# Patient Record
Sex: Female | Born: 1975 | Race: White | Hispanic: No | Marital: Single | Smoking: Former smoker
Health system: Southern US, Community
[De-identification: ages and names within clinical notes are randomized; demographics above are authoritative.]

## PROBLEM LIST (undated history)

## (undated) ENCOUNTER — Inpatient Hospital Stay (HOSPITAL_COMMUNITY): Payer: Self-pay

## (undated) DIAGNOSIS — E079 Disorder of thyroid, unspecified: Secondary | ICD-10-CM

## (undated) DIAGNOSIS — G8929 Other chronic pain: Secondary | ICD-10-CM

## (undated) DIAGNOSIS — E039 Hypothyroidism, unspecified: Secondary | ICD-10-CM

## (undated) DIAGNOSIS — M549 Dorsalgia, unspecified: Secondary | ICD-10-CM

## (undated) DIAGNOSIS — F419 Anxiety disorder, unspecified: Secondary | ICD-10-CM

## (undated) HISTORY — PX: OTHER SURGICAL HISTORY: SHX169

## (undated) HISTORY — PX: LEG SURGERY: SHX1003

## (undated) HISTORY — DX: Disorder of thyroid, unspecified: E07.9

## (undated) HISTORY — PX: TUBAL LIGATION: SHX77

---

## 2007-01-25 ENCOUNTER — Other Ambulatory Visit: Admission: RE | Admit: 2007-01-25 | Discharge: 2007-01-25 | Payer: Self-pay | Admitting: Gynecology

## 2007-08-08 ENCOUNTER — Other Ambulatory Visit: Admission: RE | Admit: 2007-08-08 | Discharge: 2007-08-08 | Payer: Self-pay | Admitting: Gynecology

## 2008-03-09 ENCOUNTER — Encounter: Admission: RE | Admit: 2008-03-09 | Discharge: 2008-03-09 | Payer: Self-pay | Admitting: Family Medicine

## 2008-04-01 ENCOUNTER — Other Ambulatory Visit: Admission: RE | Admit: 2008-04-01 | Discharge: 2008-04-01 | Payer: Self-pay | Admitting: Gynecology

## 2008-04-01 ENCOUNTER — Encounter: Payer: Self-pay | Admitting: Gynecology

## 2008-04-01 ENCOUNTER — Ambulatory Visit: Payer: Self-pay | Admitting: Gynecology

## 2008-04-10 ENCOUNTER — Ambulatory Visit: Payer: Self-pay | Admitting: Gynecology

## 2008-05-13 ENCOUNTER — Ambulatory Visit: Payer: Self-pay | Admitting: Gynecology

## 2009-08-20 ENCOUNTER — Encounter
Admission: RE | Admit: 2009-08-20 | Discharge: 2009-08-20 | Payer: Self-pay | Admitting: Physical Medicine and Rehabilitation

## 2010-02-01 ENCOUNTER — Ambulatory Visit
Admission: RE | Admit: 2010-02-01 | Discharge: 2010-02-01 | Payer: Self-pay | Source: Home / Self Care | Attending: Gynecology | Admitting: Gynecology

## 2011-06-12 ENCOUNTER — Other Ambulatory Visit (HOSPITAL_COMMUNITY): Payer: Self-pay | Admitting: Specialist

## 2011-06-12 DIAGNOSIS — N949 Unspecified condition associated with female genital organs and menstrual cycle: Secondary | ICD-10-CM

## 2011-06-16 ENCOUNTER — Ambulatory Visit (HOSPITAL_COMMUNITY)
Admission: RE | Admit: 2011-06-16 | Discharge: 2011-06-16 | Disposition: A | Discharge status: Home / Self Care | Payer: 59 | Source: Ambulatory Visit | Attending: Specialist | Admitting: Specialist

## 2011-06-16 DIAGNOSIS — N979 Female infertility, unspecified: Secondary | ICD-10-CM | POA: Insufficient documentation

## 2011-06-16 DIAGNOSIS — Z9851 Tubal ligation status: Secondary | ICD-10-CM | POA: Insufficient documentation

## 2011-06-16 DIAGNOSIS — N949 Unspecified condition associated with female genital organs and menstrual cycle: Secondary | ICD-10-CM | POA: Insufficient documentation

## 2011-06-16 MED ORDER — IOHEXOL 300 MG/ML  SOLN: 18.0000 mL | Freq: Once | INTRAMUSCULAR | Status: AC | PRN

## 2013-01-23 NOTE — L&D Delivery Note (Signed)
Delivery Note At 2:18 PM a viable and healthy female was delivered via  (Presentation: Right; Occiput ).  APGAR: 9, 9; weight pending.   Placenta status: spontaneous, intact.  Cord: 3V   Anesthesia: Epidural  Episiotomy: None Lacerations: None Suture Repair: None Est. Blood Loss (mL): 250cc Given multiparity 1000mcg cytotec placed rectally for PP hemorrhage prophylaxis  Mom to postpartum.  Baby to Couplet care / Skin to Skin.  Tali Coster H. 11/22/2013, 2:41 PM

## 2013-03-14 ENCOUNTER — Telehealth: Payer: Self-pay | Admitting: *Deleted

## 2013-03-14 ENCOUNTER — Telehealth: Payer: Self-pay | Admitting: Gynecology

## 2013-03-14 ENCOUNTER — Encounter: Payer: Self-pay | Admitting: Gynecology

## 2013-03-14 ENCOUNTER — Ambulatory Visit (INDEPENDENT_AMBULATORY_CARE_PROVIDER_SITE_OTHER): Payer: 59 | Admitting: Gynecology

## 2013-03-14 VITALS — BP 118/76

## 2013-03-14 DIAGNOSIS — N63 Unspecified lump in unspecified breast: Secondary | ICD-10-CM

## 2013-03-14 NOTE — Progress Notes (Signed)
Patient ID: Alexis GoldmannJennifer Alvarado, female   DOB: 11/22/1975, 38 y.o.   MRN: [id number] Alexis GoldmannJennifer Alvarado 08/11/1975 [id number]        38 y.o.  G4P4 presents complaining of tenderness and nodule in the right tail of Spence area noticed yesterday. No history of same before. No nipple discharge. History of screening mammography several years ago.  Past medical history,surgical history, problem list, medications, allergies, family history and social history were all reviewed and documented in the EPIC chart.  Exam: Alexis RistKari assistant General appearance  Normal Both breasts examined lying and sitting. Left without masses, retractions, discharge, adenopathy. Right with tenderness in the right tail of Spence. No definitive masses, retractions, discharge or adenopathy.  Assessment/Plan:  38 y.o. G4P4  new onset nodularity/tenderness right tail of Spence felt by patient without abnormality on physician exam. Start with diagnostic mammography/ultrasound of this area. Assuming negative in plan expectant management with SBE and reported any changes despite negative testing. Patient does have an annual scheduled next month and will followup for this regardless and will reexamine her at that time.   Note: This document was prepared with digital dictation and possible smart phrase technology. Any transcriptional errors that result from this process are unintentional.   Dara LordsFONTAINE,Sonnet Rizor P MD, 9:42 AM 03/14/2013

## 2013-03-14 NOTE — Patient Instructions (Signed)
Office will contact you to arrange mammogram/ultrasound. 

## 2013-03-14 NOTE — Telephone Encounter (Signed)
Message copied by Aura CampsWEBB, Ionna L on Fri Mar 14, 2013  4:00 PM ------      Message from: Dara LordsFONTAINE, TIMOTHY P      Created: Fri Mar 14, 2013  9:41 AM       Schedule diagnostic mammogram/ultrasound reference new tender nodularity right tail of Spence ------

## 2013-03-14 NOTE — Telephone Encounter (Signed)
Orders placed at breast center, they will contact pt to schedule. 

## 2013-03-21 NOTE — Telephone Encounter (Signed)
Breast center left message on 03/19/13

## 2013-03-25 NOTE — Telephone Encounter (Signed)
erroneous entry 

## 2013-03-26 NOTE — Telephone Encounter (Signed)
I called patient and left message on voicemail to please call breast center to schedule appointment.

## 2013-03-27 ENCOUNTER — Encounter: Payer: Self-pay | Admitting: Women's Health

## 2013-03-27 ENCOUNTER — Ambulatory Visit (INDEPENDENT_AMBULATORY_CARE_PROVIDER_SITE_OTHER): Payer: 59 | Admitting: Women's Health

## 2013-03-27 DIAGNOSIS — N912 Amenorrhea, unspecified: Secondary | ICD-10-CM

## 2013-03-27 LAB — PREGNANCY, URINE: PREG TEST UR: POSITIVE

## 2013-03-27 NOTE — Progress Notes (Signed)
Patient ID: Rodena GoldmannJennifer Boakye, female   DOB: 12/10/1975, 38 y.o.   MRN: [id number] Presents with a home positive U PT.  BTL with tubal reversal 2013. HSG 2013 left fallopian scarring, right side free spill. Was told to have HSG to check for rise when became pregnant. Denies bleeding, spotting or pain. Last cycle 02/26/2013 normal. Hypothyroid on Synthroid. Was seen in the office 2 weeks ago for questionable lump in breast and is scheduled for mammogram with ultrasound.  Exam: Appears well. Positive U PT. Abdomen soft nontender.  Early pregnancy /4 weeks by dates  Plan: HCG Quant, repeat 03/31/2013. Ectopic pregnancy precautions reviewed, instructed to  go to hospital if bright red bleeding, pain. If normal rise of hCG,  viability ultrasound after 04/11/2013. Prenatal vitamin daily, continue Synthroid, safe pregnancy behaviors reviewed. Will keep breast ultrasound appointment and inform  radiology of pregnancy.

## 2013-03-27 NOTE — Patient Instructions (Signed)
Ectopic Pregnancy °An ectopic pregnancy is when the fertilized egg attaches (implants) outside the uterus. Most ectopic pregnancies occur in the fallopian tube. Rarely do ectopic pregnancies occur on the ovary, intestine, pelvis, or cervix. In an ectopic pregnancy, the fertilized egg does not have the ability to develop into a normal, healthy baby.  °A ruptured ectopic pregnancy is one in which the fallopian tube gets torn or bursts and results in internal bleeding. Often there is intense abdominal pain, and sometimes, vaginal bleeding. Having an ectopic pregnancy can be life threatening. If left untreated, this dangerous condition can lead to a blood transfusion, abdominal surgery, or even death. °CAUSES  °Damage to the fallopian tubes is the suspected cause in most ectopic pregnancies.  °RISK FACTORS °Depending on your circumstances, the risk of having an ectopic pregnancy will vary. The level of risk can be divided into three categories. °High Risk °· You have gone through infertility treatment. °· You have had a previous ectopic pregnancy. °· You have had previous tubal surgery. °· You have had previous surgery to have the fallopian tubes tied (tubal ligation). °· You have tubal problems or diseases. °· You have been exposed to DES. DES is a medicine that was used until 1971 and had effects on babies whose mothers took the medicine. °· You become pregnant while using an intrauterine device (IUD) for birth control.  °Moderate Risk °· You have a history of infertility. °· You have a history of a sexually transmitted infection (STI). °· You have a history of pelvic inflammatory disease (PID). °· You have scarring from endometriosis. °· You have multiple sexual partners. °· You smoke.  °Low Risk °· You have had previous pelvic surgery. °· You use vaginal douching. °· You became sexually active before 38 years of age. °SIGNS AND SYMPTOMS  °An ectopic pregnancy should be suspected in anyone who has missed a period and  has abdominal pain or bleeding. °· You may experience normal pregnancy symptoms, such as: °· Nausea. °· Tiredness. °· Breast tenderness. °· Other symptoms may include: °· Pain with intercourse. °· Irregular vaginal bleeding or spotting. °· Cramping or pain on one side or in the lower abdomen. °· Fast heartbeat. °· Passing out while having a bowel movement. °· Symptoms of a ruptured ectopic pregnancy and internal bleeding may include: °· Sudden, severe pain in the abdomen and pelvis. °· Dizziness or fainting. °· Pain in the shoulder area. °DIAGNOSIS  °Tests that may be performed include: °· A pregnancy test. °· An ultrasound test. °· Testing the specific level of pregnancy hormone in the bloodstream. °· Taking a sample of uterus tissue (dilation and curettage, D&C). °· Surgery to perform a visual exam of the inside of the abdomen using a thin, lighted tube with a tiny camera on the end (laparoscope). °TREATMENT  °An injection of a medicine called methotrexate may be given. This medicine causes the pregnancy tissue to be absorbed. It is given if: °· The diagnosis is made early. °· The fallopian tube has not ruptured. °· You are considered to be a good candidate for the medicine. °Usually, pregnancy hormone blood levels are checked after methotrexate treatment. This is to be sure the medicine is effective. It may take 4 6 weeks for the pregnancy to be absorbed (though most pregnancies will be absorbed by 3 weeks). °Surgical treatment may be needed. A laparoscope may be used to remove the pregnancy tissue. If severe internal bleeding occurs, a cut (incision) may be made in the lower abdomen (laparotomy), and the   ectopic pregnancy is removed. This stops the bleeding. Part of the fallopian tube, or the whole tube, may be removed as well (salpingectomy). After surgery, pregnancy hormone tests may be done to be sure there is no pregnancy tissue left. You may receive an Rho(D) immune globulin shot if you are Rh negative and  the father is Rh positive, or if you do not know the Rh type of the father. This is to prevent problems with any future pregnancy. °SEEK IMMEDIATE MEDICAL CARE IF:  °You have any symptoms of an ectopic pregnancy. This is a medical emergency. °Document Released: 02/17/2004 Document Revised: 10/30/2012 Document Reviewed: 08/08/2012 °ExitCare® Patient Information ©2014 [employer]. ° °

## 2013-03-28 LAB — HCG, QUANTITATIVE, PREGNANCY: HCG, BETA CHAIN, QUANT, S: 235.3 m[IU]/mL

## 2013-03-31 ENCOUNTER — Other Ambulatory Visit: Payer: 59

## 2013-03-31 DIAGNOSIS — N912 Amenorrhea, unspecified: Secondary | ICD-10-CM

## 2013-03-31 LAB — HCG, QUANTITATIVE, PREGNANCY: hCG, Beta Chain, Quant, S: 1657 m[IU]/mL

## 2013-04-09 ENCOUNTER — Encounter: Payer: Self-pay | Admitting: Gynecology

## 2013-04-11 ENCOUNTER — Other Ambulatory Visit: Payer: 59

## 2013-04-11 ENCOUNTER — Other Ambulatory Visit: Payer: Self-pay | Admitting: Women's Health

## 2013-04-11 ENCOUNTER — Encounter: Payer: Self-pay | Admitting: Women's Health

## 2013-04-11 ENCOUNTER — Ambulatory Visit (INDEPENDENT_AMBULATORY_CARE_PROVIDER_SITE_OTHER): Payer: 59

## 2013-04-11 ENCOUNTER — Ambulatory Visit (INDEPENDENT_AMBULATORY_CARE_PROVIDER_SITE_OTHER): Payer: 59 | Admitting: Women's Health

## 2013-04-11 ENCOUNTER — Ambulatory Visit: Payer: 59 | Admitting: Women's Health

## 2013-04-11 VITALS — BP 110/70

## 2013-04-11 DIAGNOSIS — O09529 Supervision of elderly multigravida, unspecified trimester: Secondary | ICD-10-CM

## 2013-04-11 DIAGNOSIS — N912 Amenorrhea, unspecified: Secondary | ICD-10-CM

## 2013-04-11 DIAGNOSIS — O3680X0 Pregnancy with inconclusive fetal viability, not applicable or unspecified: Secondary | ICD-10-CM

## 2013-04-11 DIAGNOSIS — O99280 Endocrine, nutritional and metabolic diseases complicating pregnancy, unspecified trimester: Secondary | ICD-10-CM

## 2013-04-11 DIAGNOSIS — E039 Hypothyroidism, unspecified: Secondary | ICD-10-CM

## 2013-04-11 DIAGNOSIS — O99281 Endocrine, nutritional and metabolic diseases complicating pregnancy, first trimester: Secondary | ICD-10-CM

## 2013-04-11 DIAGNOSIS — E079 Disorder of thyroid, unspecified: Secondary | ICD-10-CM

## 2013-04-11 LAB — US OB TRANSVAGINAL

## 2013-04-11 NOTE — Patient Instructions (Signed)

## 2013-04-11 NOTE — Progress Notes (Signed)
Patient ID: Alexis GoldmannJennifer Alvarado, female   DOB: 11/01/1975, 38 y.o.   MRN: [id number] Presents for ultrasound to confirm dating and viability. History of BTL with tubal reversal 2013. Denies bleeding, pain, discharge. Hypothyroid on Synthroid.  Ultrasound: Intrauterine gestational sac with yolk. Fetal pole seen with fetal heart tones. Ovaries appear normal. No free fluid seen. S=D  Dates  6w 2d,  Size 5w 6d  Early pregnancy  Plan: Schedule new OB appointment, copy of ultrasound given to take to first appointment. Continue prenatal vitamins, healthy pregnancy behaviors reviewed.

## 2013-04-11 NOTE — Telephone Encounter (Signed)
Pt was seen today for ultrasound I asked blanca to please ask pt to call breast center to schedule imaging.

## 2013-04-16 LAB — OB RESULTS CONSOLE GC/CHLAMYDIA
CHLAMYDIA, DNA PROBE: NEGATIVE
GC PROBE AMP, GENITAL: NEGATIVE

## 2013-05-06 ENCOUNTER — Encounter: Payer: Self-pay | Admitting: *Deleted

## 2013-05-06 NOTE — Telephone Encounter (Signed)
Letter mailed to patient regarding the below.

## 2013-05-21 LAB — OB RESULTS CONSOLE RUBELLA ANTIBODY, IGM: RUBELLA: IMMUNE

## 2013-05-21 LAB — OB RESULTS CONSOLE RPR: RPR: NONREACTIVE

## 2013-05-21 LAB — OB RESULTS CONSOLE HIV ANTIBODY (ROUTINE TESTING): HIV: NONREACTIVE

## 2013-05-21 LAB — OB RESULTS CONSOLE HEPATITIS B SURFACE ANTIGEN: Hepatitis B Surface Ag: NEGATIVE

## 2013-09-15 ENCOUNTER — Encounter (HOSPITAL_COMMUNITY): Payer: Self-pay | Admitting: *Deleted

## 2013-09-15 ENCOUNTER — Inpatient Hospital Stay (HOSPITAL_COMMUNITY)
Admission: AD | Admit: 2013-09-15 | Discharge: 2013-09-15 | Disposition: A | Discharge status: Home / Self Care | Payer: 59 | Source: Ambulatory Visit | Attending: Obstetrics | Admitting: Obstetrics

## 2013-09-15 DIAGNOSIS — E039 Hypothyroidism, unspecified: Secondary | ICD-10-CM | POA: Diagnosis not present

## 2013-09-15 DIAGNOSIS — O99340 Other mental disorders complicating pregnancy, unspecified trimester: Secondary | ICD-10-CM | POA: Insufficient documentation

## 2013-09-15 DIAGNOSIS — O368190 Decreased fetal movements, unspecified trimester, not applicable or unspecified: Secondary | ICD-10-CM | POA: Insufficient documentation

## 2013-09-15 DIAGNOSIS — O99280 Endocrine, nutritional and metabolic diseases complicating pregnancy, unspecified trimester: Secondary | ICD-10-CM

## 2013-09-15 DIAGNOSIS — Z87891 Personal history of nicotine dependence: Secondary | ICD-10-CM | POA: Diagnosis not present

## 2013-09-15 DIAGNOSIS — F411 Generalized anxiety disorder: Secondary | ICD-10-CM | POA: Insufficient documentation

## 2013-09-15 DIAGNOSIS — E079 Disorder of thyroid, unspecified: Secondary | ICD-10-CM | POA: Diagnosis not present

## 2013-09-15 HISTORY — DX: Anxiety disorder, unspecified: F41.9

## 2013-09-15 LAB — OB RESULTS CONSOLE ANTIBODY SCREEN: ANTIBODY SCREEN: NEGATIVE

## 2013-09-15 LAB — OB RESULTS CONSOLE ABO/RH: RH TYPE: NEGATIVE

## 2013-09-15 NOTE — H&P (Signed)
38 y.o. @ [redacted]w[redacted]d [id number] comes in c/o decreased fetal movement.  Felt fetal movement this AM, but was less than normal. Denies vb/lof/ctx.  Was noted to have a reactive NST on arrival, but was not reassured and requested to speak w MD.   Past Medical History  Diagnosis Date  . Thyroid disease     hypothyroid  . Anxiety     Past Surgical History  Procedure Laterality Date  . Tubual reversal    . Tubal ligation    . Leg surgery      OB History  Gravida Para Term Preterm AB SAB TAB Ectopic Multiple Living  # Outcome Date GA Lbr Len/2nd Weight Sex Delivery Anes PTL Lv  5 CUR           4 PAR           3 PAR           2 PAR           1 PAR               History   Social History  . Marital Status: Married    Spouse Name: N/A    Number of Children: N/A  . Years of Education: N/A   Occupational History  . Not on file.   Social History Main Topics  . Smoking status: Former Smoker    Quit date: 09/15/2000  . Smokeless tobacco: Never Used  . Alcohol Use: Yes     Comment: occassional  . Drug Use: No  . Sexual Activity: Yes    Birth Control/ Protection: None   Other Topics Concern  . Not on file   Social History Narrative  . No narrative on file   Review of patient's allergies indicates no known allergies.    Prenatal Transfer Tool  Maternal Diabetes: No Genetic Screening: Declined  Other PNC: hypothyroid, anxiety.    Filed Vitals:   09/15/13 1742  BP: 118/67  Pulse: 88  Temp: 98.1 F (36.7 C)  Resp: 18     Lungs/Cor:  NAD Abdomen:  soft, gravid FHTs:  140s, mod var, reactive NST (upon arrival to room, EFM tracing maternal HR, FHR confirmed to still be in 140s) Toco:  quiet   A/P  [id number] @ [redacted]w[redacted]d w decreased FM. Reactive NST.  Reviewed fetal monitoring in detail w patient and partner.  Discussed fetal kick counts.  Patient and partner reassured.  Keep scheduled appt in clinic.     [place], [facility]

## 2013-09-15 NOTE — MAU Note (Signed)
Urine in lab 

## 2013-09-15 NOTE — MAU Note (Signed)
Pt states here for decreased fetal movement. Was not seen in office today. No bleeding or lof.

## 2013-10-28 ENCOUNTER — Inpatient Hospital Stay (HOSPITAL_COMMUNITY)
Admission: AD | Admit: 2013-10-28 | Discharge: 2013-10-28 | Disposition: A | Discharge status: Home / Self Care | Payer: 59 | Source: Ambulatory Visit | Attending: Obstetrics & Gynecology | Admitting: Obstetrics & Gynecology

## 2013-10-28 ENCOUNTER — Encounter (HOSPITAL_COMMUNITY): Payer: Self-pay

## 2013-10-28 DIAGNOSIS — Z87891 Personal history of nicotine dependence: Secondary | ICD-10-CM | POA: Insufficient documentation

## 2013-10-28 DIAGNOSIS — N949 Unspecified condition associated with female genital organs and menstrual cycle: Secondary | ICD-10-CM

## 2013-10-28 DIAGNOSIS — O9989 Other specified diseases and conditions complicating pregnancy, childbirth and the puerperium: Secondary | ICD-10-CM | POA: Insufficient documentation

## 2013-10-28 DIAGNOSIS — R109 Unspecified abdominal pain: Secondary | ICD-10-CM | POA: Insufficient documentation

## 2013-10-28 LAB — URINE MICROSCOPIC-ADD ON

## 2013-10-28 LAB — URINALYSIS, ROUTINE W REFLEX MICROSCOPIC
Bilirubin Urine: NEGATIVE
Glucose, UA: NEGATIVE mg/dL
Ketones, ur: NEGATIVE mg/dL
LEUKOCYTES UA: NEGATIVE
Nitrite: NEGATIVE
PH: 6 (ref 5.0–8.0)
PROTEIN: NEGATIVE mg/dL
Specific Gravity, Urine: <1.005 — ABNORMAL LOW (ref 1.005–1.030)
Urobilinogen, UA: 0.2 mg/dL (ref 0.0–1.0)

## 2013-10-28 NOTE — MAU Note (Addendum)
Had protein in urine at last visit.  Having right mid abd pain, on right side and pelvic pressure, started last night.  Denies bleeding.  Denies leaking.  Baby not moving as much today.

## 2013-10-28 NOTE — Discharge Instructions (Signed)

## 2013-10-28 NOTE — MAU Provider Note (Signed)
Chief Complaint:  Abdominal Pain   First Provider Initiated Contact with Patient 10/28/13 2002      HPI: Alexis Alvarado is a 38 y.o. [id number] at [redacted]w[redacted]d who presents reporting right sided abdominal pain. Pain is constant but waxes and wanes, worse when she rolls over. Also pelvic pressure and suprapubic discomfort with moving. She was told she had proteinuria with last PN visit and told to come in if she experienced right upper abdominal pain. She denies dysuria, hematuria, or urgency or frequency of urination. Denies blood pressure issues at prenatal visits.  Denies painful contractions, leakage of fluid or vaginal bleeding. Good fetal movement.   Pregnancy Course: Obese; hx PTB, hypothyroidism, depression  Past Medical History: Past Medical History  Diagnosis Date  . Thyroid disease     hypothyroid  . Anxiety     Past obstetric history: OB History  Gravida Para Term Preterm AB SAB TAB Ectopic Multiple Living  5 4 3 1      4     # Outcome Date GA Lbr Len/2nd Weight Sex Delivery Anes PTL Lv  5 CUR           4 PRE      SVD EPI  Y     Comments: 36 wks  3 TRM      SVD EPI  Y  2 TRM      SVD   Y  1 TRM      SVD   Y      Past Surgical History: Past Surgical History  Procedure Laterality Date  . Tubual reversal    . Tubal ligation    . Leg surgery       Family History: Family History  Problem Relation Age of Onset  . Hypertension Father     Social History: History  Substance Use Topics  . Smoking status: Former Smoker    Quit date: 09/15/2000  . Smokeless tobacco: Never Used  . Alcohol Use: Yes     Comment: occassional before pregnancy    Allergies: No Known Allergies  Meds:  Prescriptions prior to admission  Medication Sig Dispense Refill  . buPROPion (WELLBUTRIN XL) 150 MG 24 hr tablet Take 150 mg by mouth daily.      Marland Kitchen levothyroxine (SYNTHROID, LEVOTHROID) 75 MCG tablet Take 75 mcg by mouth daily before breakfast.      . Prenatal Vit-Fe Fumarate-FA (PRENATAL  MULTIVITAMIN) TABS tablet Take 1 tablet by mouth daily at 12 noon.        ROS: Pertinent findings in history of present illness.  Physical Exam  Blood pressure 120/71, pulse 100, temperature 98.7 F (37.1 C), temperature source Oral, resp. rate 18, height 5\' 3"  (1.6 m), weight 99.338 kg (219 lb), last menstrual period 02/26/2013, SpO2 96.00%. GENERAL: Obese female in no acute distress.  HEENT: normocephalic HEART: normal rate RESP: normal effort ABDOMEN: Soft, non-tender, gravid appropriate for gestational age EXTREMITIES: Nontender, no edema NEURO: alert and oriented    SVE: L/C/H  FHT:  Baseline 140-145 , moderate variability, accelerations present, no decelerations Contractions: irregular, mild   Labs: Results for orders placed during the hospital encounter of 10/28/13 (from the past 24 hour(s))  URINALYSIS, ROUTINE W REFLEX MICROSCOPIC     Status: Abnormal   Collection Time    10/28/13  7:15 PM      Result Value Ref Range   Color, Urine YELLOW  YELLOW   APPearance CLEAR  CLEAR   Specific Gravity, Urine <1.005 (*) 1.005 - 1.030  pH 6.0  5.0 - 8.0   Glucose, UA NEGATIVE  NEGATIVE mg/dL   Hgb urine dipstick TRACE (*) NEGATIVE   Bilirubin Urine NEGATIVE  NEGATIVE   Ketones, ur NEGATIVE  NEGATIVE mg/dL   Protein, ur NEGATIVE  NEGATIVE mg/dL   Urobilinogen, UA 0.2  0.0 - 1.0 mg/dL   Nitrite NEGATIVE  NEGATIVE   Leukocytes, UA NEGATIVE  NEGATIVE  URINE MICROSCOPIC-ADD ON     Status: Abnormal   Collection Time    10/28/13  7:15 PM      Result Value Ref Range   Squamous Epithelial / LPF MANY (*) RARE   WBC, UA 3-6  <3 WBC/hpf   RBC / HPF 0-2  <3 RBC/hpf   Bacteria, UA MANY (*) RARE    Imaging:  No results found.  MAU Course: Reactive NST C/W Dr. Mora ApplPinn  Assessment: 1. Round ligament pain   G5P3104 at 5925w6d Category 1 FHR  Plan: Discharge home Labor precautions and fetal kick counts    Medication List         buPROPion 150 MG 24 hr tablet  Commonly known  as:  WELLBUTRIN XL  Take 150 mg by mouth daily.     levothyroxine 75 MCG tablet  Commonly known as:  SYNTHROID, LEVOTHROID  Take 75 mcg by mouth daily before breakfast.     prenatal multivitamin Tabs tablet  Take 1 tablet by mouth daily at 12 noon.       Follow-up Information   Follow up with Levi AlandANDERSON,MARK E, MD. (Keep your scheduled prenatal appointment)    Specialty:  Obstetrics and Gynecology   Contact information:   [address] [place] [id number] [phone]       Danae Orleanseirdre C Ahmet Schank, CNM 10/28/2013 8:20 PM

## 2013-10-29 NOTE — MAU Provider Note (Signed)
Reviewed case with CNM and I agree with above  Shikha Bibb STACIA  

## 2013-11-03 LAB — OB RESULTS CONSOLE GBS: GBS: NEGATIVE

## 2013-11-05 ENCOUNTER — Encounter (HOSPITAL_COMMUNITY): Payer: Self-pay | Admitting: *Deleted

## 2013-11-05 ENCOUNTER — Inpatient Hospital Stay (HOSPITAL_COMMUNITY)
Admission: AD | Admit: 2013-11-05 | Discharge: 2013-11-05 | Disposition: A | Discharge status: Home / Self Care | Payer: 59 | Source: Ambulatory Visit | Attending: Obstetrics and Gynecology | Admitting: Obstetrics and Gynecology

## 2013-11-05 DIAGNOSIS — Z3A36 36 weeks gestation of pregnancy: Secondary | ICD-10-CM | POA: Diagnosis not present

## 2013-11-05 HISTORY — DX: Other chronic pain: G89.29

## 2013-11-05 HISTORY — DX: Dorsalgia, unspecified: M54.9

## 2013-11-05 HISTORY — DX: Hypothyroidism, unspecified: E03.9

## 2013-11-05 NOTE — MAU Note (Signed)
Patient states she is having contractions every 6-10 minutes, denies bleeding or discharge and reports good fetal movement.

## 2013-11-05 NOTE — Discharge Instructions (Signed)
Keep scheduled appointment for prenatal care. Call the office or provider on call with further concerns or return to MAU as needed.

## 2013-11-05 NOTE — MAU Note (Signed)
Urine in lab 

## 2013-11-21 ENCOUNTER — Inpatient Hospital Stay (HOSPITAL_COMMUNITY)
Admission: AD | Admit: 2013-11-21 | Discharge: 2013-11-24 | DRG: 775 | Disposition: A | Discharge status: Home / Self Care | Payer: 59 | Source: Ambulatory Visit | Attending: Obstetrics and Gynecology | Admitting: Obstetrics and Gynecology

## 2013-11-21 ENCOUNTER — Encounter (HOSPITAL_COMMUNITY): Payer: Self-pay

## 2013-11-21 DIAGNOSIS — Z87891 Personal history of nicotine dependence: Secondary | ICD-10-CM

## 2013-11-21 DIAGNOSIS — O471 False labor at or after 37 completed weeks of gestation: Secondary | ICD-10-CM | POA: Diagnosis present

## 2013-11-21 DIAGNOSIS — E039 Hypothyroidism, unspecified: Secondary | ICD-10-CM | POA: Diagnosis present

## 2013-11-21 DIAGNOSIS — Z8249 Family history of ischemic heart disease and other diseases of the circulatory system: Secondary | ICD-10-CM

## 2013-11-21 DIAGNOSIS — Z3A38 38 weeks gestation of pregnancy: Secondary | ICD-10-CM | POA: Diagnosis present

## 2013-11-21 DIAGNOSIS — O99284 Endocrine, nutritional and metabolic diseases complicating childbirth: Secondary | ICD-10-CM | POA: Diagnosis present

## 2013-11-21 DIAGNOSIS — O09523 Supervision of elderly multigravida, third trimester: Secondary | ICD-10-CM | POA: Diagnosis not present

## 2013-11-21 DIAGNOSIS — O0943 Supervision of pregnancy with grand multiparity, third trimester: Secondary | ICD-10-CM | POA: Diagnosis not present

## 2013-11-21 LAB — CBC
HCT: 33.3 % — ABNORMAL LOW (ref 36.0–46.0)
Hemoglobin: 11.2 g/dL — ABNORMAL LOW (ref 12.0–15.0)
MCH: 28.7 pg (ref 26.0–34.0)
MCHC: 33.6 g/dL (ref 30.0–36.0)
MCV: 85.4 fL (ref 78.0–100.0)
PLATELETS: 146 10*3/uL — AB (ref 150–400)
RBC: 3.9 MIL/uL (ref 3.87–5.11)
RDW: 15.1 % (ref 11.5–15.5)
WBC: 10.4 10*3/uL (ref 4.0–10.5)

## 2013-11-21 MED ORDER — ONDANSETRON HCL 4 MG/2ML IJ SOLN: 4.0000 mg | Freq: Four times a day (QID) | INTRAMUSCULAR | Status: DC | PRN

## 2013-11-21 MED ORDER — FLEET ENEMA 7-19 GM/118ML RE ENEM
1.0000 | ENEMA | RECTAL | Status: DC | PRN
Start: 2013-11-21 — End: 2013-11-22

## 2013-11-21 MED ORDER — OXYCODONE-ACETAMINOPHEN 5-325 MG PO TABS: 2.0000 | ORAL_TABLET | ORAL | Status: DC | PRN

## 2013-11-21 MED ORDER — CITRIC ACID-SODIUM CITRATE 334-500 MG/5ML PO SOLN
30.0000 mL | ORAL | Status: DC | PRN
Start: 2013-11-21 — End: 2013-11-22

## 2013-11-21 MED ORDER — OXYTOCIN BOLUS FROM INFUSION: 500.0000 mL | INTRAVENOUS | Status: DC

## 2013-11-21 MED ORDER — LACTATED RINGERS IV SOLN
INTRAVENOUS | Status: DC
  Administered 2013-11-22: via INTRAVENOUS

## 2013-11-21 MED ORDER — OXYTOCIN 40 UNITS IN LACTATED RINGERS INFUSION - SIMPLE MED
62.5000 mL/h | INTRAVENOUS | Status: DC
  Administered 2013-11-22: 500 mL/h via INTRAVENOUS

## 2013-11-21 MED ORDER — EPHEDRINE 5 MG/ML INJ
10.0000 mg | INTRAVENOUS | Status: DC | PRN
  Filled 2013-11-21: qty 2

## 2013-11-21 MED ORDER — FENTANYL 2.5 MCG/ML BUPIVACAINE 1/10 % EPIDURAL INFUSION (WH - ANES)
14.0000 mL/h | INTRAMUSCULAR | Status: DC | PRN
  Administered 2013-11-22: 14 mL/h via EPIDURAL

## 2013-11-21 MED ORDER — LACTATED RINGERS IV SOLN
500.0000 mL | Freq: Once | INTRAVENOUS | Status: AC
  Administered 2013-11-22: 500 mL via INTRAVENOUS

## 2013-11-21 MED ORDER — OXYCODONE-ACETAMINOPHEN 5-325 MG PO TABS: 1.0000 | ORAL_TABLET | ORAL | Status: DC | PRN

## 2013-11-21 MED ORDER — PHENYLEPHRINE 40 MCG/ML (10ML) SYRINGE FOR IV PUSH (FOR BLOOD PRESSURE SUPPORT)
80.0000 ug | PREFILLED_SYRINGE | INTRAVENOUS | Status: DC | PRN
  Filled 2013-11-21: qty 2

## 2013-11-21 MED ORDER — LIDOCAINE HCL (PF) 1 % IJ SOLN
30.0000 mL | INTRAMUSCULAR | Status: DC | PRN
  Filled 2013-11-21: qty 30

## 2013-11-21 MED ORDER — LACTATED RINGERS IV SOLN: 500.0000 mL | INTRAVENOUS | Status: DC | PRN

## 2013-11-21 MED ORDER — ACETAMINOPHEN 325 MG PO TABS: 650.0000 mg | ORAL_TABLET | ORAL | Status: DC | PRN

## 2013-11-21 MED ORDER — DIPHENHYDRAMINE HCL 50 MG/ML IJ SOLN: 12.5000 mg | INTRAMUSCULAR | Status: DC | PRN

## 2013-11-21 NOTE — Progress Notes (Signed)
NOtified of pt arrival in MAU, vag exam and discomfort level. [facility]

## 2013-11-21 NOTE — MAU Note (Signed)
Report called to [facility] Charge RN on [facility]. Strip reviewed. Will go to 162

## 2013-11-21 NOTE — MAU Note (Signed)
Urine in lab 

## 2013-11-21 NOTE — MAU Note (Signed)
Regular contractions for last 2 to 2.5hrs. Some bloody show 2cm last sve. Denies LOF

## 2013-11-22 ENCOUNTER — Inpatient Hospital Stay (HOSPITAL_COMMUNITY): Payer: 59 | Admitting: Anesthesiology

## 2013-11-22 ENCOUNTER — Encounter (HOSPITAL_COMMUNITY): Payer: Self-pay | Admitting: *Deleted

## 2013-11-22 ENCOUNTER — Encounter (HOSPITAL_COMMUNITY): Payer: 59 | Admitting: Anesthesiology

## 2013-11-22 LAB — TYPE AND SCREEN
ABO/RH(D): O NEG
Antibody Screen: NEGATIVE

## 2013-11-22 LAB — ABO/RH: ABO/RH(D): O NEG

## 2013-11-22 LAB — RPR

## 2013-11-22 MED ORDER — BUPROPION HCL ER (XL) 150 MG PO TB24
150.0000 mg | ORAL_TABLET | Freq: Every day | ORAL | Status: DC
  Administered 2013-11-23 – 2013-11-24 (×2): 150 mg via ORAL
  Filled 2013-11-22 (×3): qty 1

## 2013-11-22 MED ORDER — SIMETHICONE 80 MG PO CHEW: 80.0000 mg | CHEWABLE_TABLET | ORAL | Status: DC | PRN

## 2013-11-22 MED ORDER — MISOPROSTOL 200 MCG PO TABS
1000.0000 ug | ORAL_TABLET | Freq: Once | ORAL | Status: AC
  Administered 2013-11-22: 1000 ug via RECTAL
  Filled 2013-11-22: qty 5

## 2013-11-22 MED ORDER — FENTANYL 2.5 MCG/ML BUPIVACAINE 1/10 % EPIDURAL INFUSION (WH - ANES)
INTRAMUSCULAR | Status: AC
  Administered 2013-11-22: 09:00:00
  Filled 2013-11-22: qty 125

## 2013-11-22 MED ORDER — PHENYLEPHRINE 40 MCG/ML (10ML) SYRINGE FOR IV PUSH (FOR BLOOD PRESSURE SUPPORT)
PREFILLED_SYRINGE | INTRAVENOUS | Status: AC
  Filled 2013-11-22: qty 10

## 2013-11-22 MED ORDER — ZOLPIDEM TARTRATE 5 MG PO TABS: 5.0000 mg | ORAL_TABLET | Freq: Every evening | ORAL | Status: DC | PRN

## 2013-11-22 MED ORDER — PRENATAL MULTIVITAMIN CH
1.0000 | ORAL_TABLET | Freq: Every day | ORAL | Status: DC
  Administered 2013-11-23 – 2013-11-24 (×2): 1 via ORAL
  Filled 2013-11-22 (×2): qty 1

## 2013-11-22 MED ORDER — BUTORPHANOL TARTRATE 1 MG/ML IJ SOLN
1.0000 mg | INTRAMUSCULAR | Status: DC | PRN
  Administered 2013-11-22 (×2): 1 mg via INTRAVENOUS
  Filled 2013-11-22 (×2): qty 1

## 2013-11-22 MED ORDER — OXYCODONE-ACETAMINOPHEN 5-325 MG PO TABS: 2.0000 | ORAL_TABLET | ORAL | Status: DC | PRN

## 2013-11-22 MED ORDER — OXYCODONE-ACETAMINOPHEN 5-325 MG PO TABS: 1.0000 | ORAL_TABLET | ORAL | Status: DC | PRN

## 2013-11-22 MED ORDER — ONDANSETRON HCL 4 MG PO TABS: 4.0000 mg | ORAL_TABLET | ORAL | Status: DC | PRN

## 2013-11-22 MED ORDER — BENZOCAINE-MENTHOL 20-0.5 % EX AERO
1.0000 "application " | INHALATION_SPRAY | CUTANEOUS | Status: DC | PRN
  Administered 2013-11-22: 1 via TOPICAL
  Filled 2013-11-22: qty 56

## 2013-11-22 MED ORDER — ONDANSETRON HCL 4 MG/2ML IJ SOLN: 4.0000 mg | INTRAMUSCULAR | Status: DC | PRN

## 2013-11-22 MED ORDER — METHYLERGONOVINE MALEATE 0.2 MG PO TABS: 0.2000 mg | ORAL_TABLET | ORAL | Status: DC | PRN

## 2013-11-22 MED ORDER — IBUPROFEN 600 MG PO TABS
600.0000 mg | ORAL_TABLET | Freq: Four times a day (QID) | ORAL | Status: DC
  Administered 2013-11-22 – 2013-11-24 (×8): 600 mg via ORAL
  Filled 2013-11-22 (×8): qty 1

## 2013-11-22 MED ORDER — EPHEDRINE 5 MG/ML INJ
INTRAVENOUS | Status: AC
  Filled 2013-11-22: qty 4

## 2013-11-22 MED ORDER — WITCH HAZEL-GLYCERIN EX PADS
1.0000 | MEDICATED_PAD | CUTANEOUS | Status: DC | PRN
Start: 2013-11-22 — End: 2013-11-24

## 2013-11-22 MED ORDER — LIDOCAINE HCL (PF) 1 % IJ SOLN
INTRAMUSCULAR | Status: DC | PRN
  Administered 2013-11-22 (×4): 4 mL

## 2013-11-22 MED ORDER — SENNOSIDES-DOCUSATE SODIUM 8.6-50 MG PO TABS
2.0000 | ORAL_TABLET | ORAL | Status: DC
  Filled 2013-11-22: qty 2

## 2013-11-22 MED ORDER — TETANUS-DIPHTH-ACELL PERTUSSIS 5-2.5-18.5 LF-MCG/0.5 IM SUSP
0.5000 mL | Freq: Once | INTRAMUSCULAR | Status: AC
  Administered 2013-11-23: 0.5 mL via INTRAMUSCULAR
  Filled 2013-11-22: qty 0.5

## 2013-11-22 MED ORDER — DIBUCAINE 1 % RE OINT
1.0000 | TOPICAL_OINTMENT | RECTAL | Status: DC | PRN
Start: 2013-11-22 — End: 2013-11-24

## 2013-11-22 MED ORDER — TERBUTALINE SULFATE 1 MG/ML IJ SOLN: 0.2500 mg | Freq: Once | INTRAMUSCULAR | Status: DC | PRN

## 2013-11-22 MED ORDER — METHYLERGONOVINE MALEATE 0.2 MG/ML IJ SOLN
0.2000 mg | Freq: Once | INTRAMUSCULAR | Status: DC
  Filled 2013-11-22: qty 1

## 2013-11-22 MED ORDER — OXYTOCIN 40 UNITS IN LACTATED RINGERS INFUSION - SIMPLE MED
1.0000 m[IU]/min | INTRAVENOUS | Status: DC
  Administered 2013-11-22: 2 m[IU]/min via INTRAVENOUS
  Administered 2013-11-22: 6 m[IU]/min via INTRAVENOUS
  Filled 2013-11-22: qty 1000

## 2013-11-22 MED ORDER — LEVOTHYROXINE SODIUM 75 MCG PO TABS
75.0000 ug | ORAL_TABLET | Freq: Every day | ORAL | Status: DC
  Administered 2013-11-23 – 2013-11-24 (×2): 75 ug via ORAL
  Filled 2013-11-22 (×2): qty 1

## 2013-11-22 MED ORDER — LANOLIN HYDROUS EX OINT
TOPICAL_OINTMENT | CUTANEOUS | Status: DC | PRN
Start: 2013-11-22 — End: 2013-11-24

## 2013-11-22 MED ORDER — METHYLERGONOVINE MALEATE 0.2 MG/ML IJ SOLN: 0.2000 mg | INTRAMUSCULAR | Status: DC | PRN

## 2013-11-22 MED ORDER — DIPHENHYDRAMINE HCL 25 MG PO CAPS: 25.0000 mg | ORAL_CAPSULE | Freq: Four times a day (QID) | ORAL | Status: DC | PRN

## 2013-11-22 NOTE — H&P (Signed)
Alexis GoldmannJennifer Alvarado is a 38 y.o. female presenting for contractions  38 yo G5P3104 @ 38+2 presents for painful contractions and was found to be in labor. Her pregnancy has been complicated by AMA History OB History   Grav Para Term Preterm Abortions TAB SAB Ect Mult Living   5 4 3 1      4      Past Medical History  Diagnosis Date  . Thyroid disease     hypothyroid  . Anxiety   . Hypothyroidism   . Chronic back pain    Past Surgical History  Procedure Laterality Date  . Tubual reversal    . Tubal ligation    . Leg surgery     Family History: family history includes Hypertension in her father. Social History:  reports that she quit smoking about 13 years ago. She has never used smokeless tobacco. She reports that she drinks alcohol. She reports that she does not use illicit drugs.   Prenatal Transfer Tool  Maternal Diabetes: No Genetic Screening: Declined Maternal Ultrasounds/Referrals: Normal Fetal Ultrasounds or other Referrals:  None Maternal Substance Abuse:  No Significant Maternal Medications:  None Significant Maternal Lab Results:  None Other Comments:  None  ROS  Dilation: 2 Effacement (%): 60 Station: -3 Exam by:: Alexis Alvarado Blood pressure 128/60, pulse 88, temperature 98.2 F (36.8 C), temperature source Oral, resp. rate 16, height 5\' 5"  (1.651 m), weight 100.245 kg (221 lb), last menstrual period 02/26/2013. Exam Physical Exam  Prenatal labs: ABO, Rh: --/--/O NEG, O NEG (10/30 2340) Antibody: NEG (10/30 2340) Rubella: Immune (04/29 0000) RPR: Nonreactive (04/29 0000)  HBsAg: Negative (04/29 0000)  HIV: Non-reactive (04/29 0000)  GBS: Negative (10/12 0000)   Assessment/Plan: 1) Admit 2) Epidural on request 3) Expectant mgt   Alexis Alvarado H. 11/22/2013, 4:56 AM

## 2013-11-22 NOTE — Anesthesia Procedure Notes (Signed)
Epidural Patient location during procedure: OB Start time: 11/22/2013 8:31 AM  Staffing Anesthesiologist: Hagan Vanauken Performed by: anesthesiologist   Preanesthetic Checklist Completed: patient identified, site marked, surgical consent, pre-op evaluation, timeout performed, IV checked, risks and benefits discussed and monitors and equipment checked  Epidural Patient position: sitting Prep: site prepped and draped and DuraPrep Patient monitoring: continuous pulse ox and blood pressure Approach: midline Location: L3-L4 Injection technique: LOR air  Needle:  Needle type: Tuohy  Needle gauge: 17 G Needle length: 9 cm and 9 Needle insertion depth: 5 cm cm Catheter type: closed end flexible Catheter size: 19 Gauge Catheter at skin depth: 9 cm Test dose: negative  Assessment Events: blood not aspirated, injection not painful, no injection resistance, negative IV test and no paresthesia  Additional Notes Discussed risk of headache, infection, bleeding, nerve injury and failed or incomplete block.  Patient voices understanding and wishes to proceed.  Epidural placed easily on first attempt.  No paresthesia.  +heme in catheter when threaded to 10 cm, so pulled back to 9 cm.  Test doses negative.  Patient tolerated procedure well.  Jasmine DecemberA. Bralyn Espino, MDReason for block:procedure for pain

## 2013-11-22 NOTE — Lactation Note (Signed)
This note was copied from the chart of Alexis Rodena GoldmannJennifer Kotara. Lactation Consultation Note  Mother's nipples invert when compressed but evert with hand pump and hand expression. Baby sleepy.  Hand expressed colostrum and provided it to baby on spoon to stimulate him to breastfeed. Encouraged STS.  Reviewed how to use shells.    Patient Name: Alexis Rodena GoldmannJennifer Straw [id number]'s Date: 11/22/2013 Reason for consult: Initial assessment   Maternal Data Has patient been taught Hand Expression?: Yes Does the patient have breastfeeding experience prior to this delivery?: No  Feeding    LATCH Score/Interventions                      Lactation Tools Discussed/Used     Consult Status Consult Status: Follow-up Date: 11/23/13 Follow-up type: In-patient    Dahlia ByesBerkelhammer, Ruth [facility] 11/22/2013, 10:53 PM

## 2013-11-22 NOTE — Anesthesia Preprocedure Evaluation (Signed)
Anesthesia Evaluation  Patient identified by MRN, date of birth, ID band Patient awake    Reviewed: Allergy & Precautions, H&P , NPO status , Patient's Chart, lab work & pertinent test results, reviewed documented beta blocker date and time   History of Anesthesia Complications Negative for: history of anesthetic complications  Airway Mallampati: I  TM Distance: >3 FB Neck ROM: full    Dental  (+) Teeth Intact   Pulmonary neg pulmonary ROS, former smoker,  breath sounds clear to auscultation        Cardiovascular negative cardio ROS  Rhythm:regular Rate:Normal     Neuro/Psych Anxiety Chronic low back pain (h/o right leg radiculopathy, but not currently)    GI/Hepatic negative GI ROS, Neg liver ROS,   Endo/Other  Hypothyroidism BMI 37  Renal/GU negative Renal ROS  negative genitourinary   Musculoskeletal   Abdominal   Peds  Hematology negative hematology ROS (+)   Anesthesia Other Findings   Reproductive/Obstetrics (+) Pregnancy                             Anesthesia Physical Anesthesia Plan  ASA: II  Anesthesia Plan: Epidural   Post-op Pain Management:    Induction:   Airway Management Planned:   Additional Equipment:   Intra-op Plan:   Post-operative Plan:   Informed Consent: I have reviewed the patients History and Physical, chart, labs and discussed the procedure including the risks, benefits and alternatives for the proposed anesthesia with the patient or authorized representative who has indicated his/her understanding and acceptance.     Plan Discussed with:   Anesthesia Plan Comments:         Anesthesia Quick Evaluation

## 2013-11-23 LAB — CBC
HCT: 30.2 % — ABNORMAL LOW (ref 36.0–46.0)
Hemoglobin: 10 g/dL — ABNORMAL LOW (ref 12.0–15.0)
MCH: 28.5 pg (ref 26.0–34.0)
MCHC: 33.1 g/dL (ref 30.0–36.0)
MCV: 86 fL (ref 78.0–100.0)
PLATELETS: 118 10*3/uL — AB (ref 150–400)
RBC: 3.51 MIL/uL — ABNORMAL LOW (ref 3.87–5.11)
RDW: 15.1 % (ref 11.5–15.5)
WBC: 10.9 10*3/uL — ABNORMAL HIGH (ref 4.0–10.5)

## 2013-11-23 MED ORDER — RHO D IMMUNE GLOBULIN 1500 UNIT/2ML IJ SOSY
300.0000 ug | PREFILLED_SYRINGE | Freq: Once | INTRAMUSCULAR | Status: AC
  Administered 2013-11-23: 300 ug via INTRAMUSCULAR
  Filled 2013-11-23: qty 2

## 2013-11-23 NOTE — Anesthesia Postprocedure Evaluation (Signed)
Anesthesia Post Note  Patient: Rodena GoldmannJennifer Dible  Procedure(s) Performed: * No procedures listed *  Anesthesia type: Epidural  Patient location: Mother/Baby  Post pain: Pain level controlled  Post assessment: Post-op Vital signs reviewed  Last Vitals:  Filed Vitals:   11/23/13 0634  BP: 127/58  Pulse: 74  Temp: 36.8 C  Resp: 18    Post vital signs: Reviewed  Level of consciousness:alert  Complications: No apparent anesthesia complications

## 2013-11-23 NOTE — Plan of Care (Signed)
Problem: Discharge Progression Outcomes Goal: Tolerating diet Outcome: Completed/Met Date Met:  11/23/13 Goal: MMR given as ordered Outcome: Not Applicable Date Met:  04/79/98

## 2013-11-23 NOTE — Progress Notes (Signed)
Post Partum Day 1 Subjective: Pt sleeping, did not disturb  Objective: Blood pressure 127/58, pulse 74, temperature 98.2 F (36.8 C), temperature source Oral, resp. rate 18, height 5\' 5"  (1.651 m), weight 100.245 kg (221 lb), last menstrual period 02/26/2013, SpO2 97 %, unknown if currently breastfeeding.   Recent Labs  11/21/13 2340 11/23/13 0619  HGB 11.2* 10.0*  HCT 33.3* 30.2*    Assessment/Plan: Plan for discharge tomorrow  Desires circ, unable to perform at this time d/t the baby having not voided   LOS: 2 days   Alexis Alvarado H. 11/23/2013, 12:58 PM

## 2013-11-23 NOTE — Anesthesia Postprocedure Evaluation (Signed)
Anesthesia Post Note  Patient: Alexis Alvarado  Procedure(s) Performed: * No procedures listed *  Anesthesia type: Epidural  Patient location: Mother/Baby  Post pain: Pain level controlled  Post assessment: Post-op Vital signs reviewed  Last Vitals:  Filed Vitals:   11/23/13 0634  BP: 127/58  Pulse: 74  Temp: 36.8 C  Resp: 18    Post vital signs: Reviewed  Level of consciousness:alert  Complications: No apparent anesthesia complications  

## 2013-11-24 ENCOUNTER — Encounter (HOSPITAL_COMMUNITY): Payer: Self-pay | Admitting: *Deleted

## 2013-11-24 LAB — RH IG WORKUP (INCLUDES ABO/RH)
ABO/RH(D): O NEG
Fetal Screen: NEGATIVE
Gestational Age(Wks): 38.3
Unit division: 0

## 2013-11-24 MED ORDER — IBUPROFEN 600 MG PO TABS: 600.0000 mg | ORAL_TABLET | Freq: Four times a day (QID) | ORAL | Status: DC | PRN

## 2013-11-24 NOTE — Plan of Care (Signed)
Problem: Discharge Progression Outcomes Goal: Barriers To Progression Addressed/Resolved Outcome: Completed/Met Date Met:  11/24/13 Goal: Activity appropriate for discharge plan Outcome: Completed/Met Date Met:  81/84/03 Goal: Complications resolved/controlled Outcome: Completed/Met Date Met:  11/24/13 Goal: Pain controlled with appropriate interventions Outcome: Completed/Met Date Met:  11/24/13 Goal: Afebrile, VS remain stable at discharge Outcome: Completed/Met Date Met:  11/24/13 Goal: Discharge plan in place and appropriate Outcome: Completed/Met Date Met:  11/24/13 Goal: Other Discharge Outcomes/Goals Outcome: Completed/Met Date Met:  11/24/13

## 2013-11-24 NOTE — Progress Notes (Signed)
Patient is eating, ambulating, voiding.  Pain control is good.  Lochia minimal.  Has decided not to breastfeed  Filed Vitals:   11/22/13 2130 11/23/13 0634 11/23/13 1729 11/24/13 0720  BP: 125/64 127/58 123/62 110/44  Pulse: 85 74 79 64  Temp: 98.6 F (37 C) 98.2 F (36.8 C) 98.4 F (36.9 C) 98.1 F (36.7 C)  TempSrc: Oral Oral Oral   Resp: 20 18 18 18   Height:      Weight:      SpO2:        Fundus firm Ext symmetric, neg homan's.  Lab Results  Component Value Date   WBC 10.9* 11/23/2013   HGB 10.0* 11/23/2013   HCT 30.2* 11/23/2013   MCV 86.0 11/23/2013   PLT 118* 11/23/2013    --/--/O NEG (11/01 0619)/R non-immune  A/P Post partum day 2. Rh neg: s/p rhogam Received tdap  Routine care.  Expect d/c today.  Desires circumcisison. Discussed r/b/a of the procedure. Reviewed that circumcision is an elective surgical procedure and not considered medically necessary. Reviewed the risks of the procedure including the risk of infection, bleeding, damage to surrounding structures, including scrotum, shaft, urethra and head of penis, and an undesired cosmetic effect requiring additional procedures for revision. Consent signed.     IdaLARK, [facility] Hospital[facility]ANNA

## 2013-11-24 NOTE — Discharge Instructions (Signed)

## 2013-11-24 NOTE — Discharge Summary (Signed)
Obstetric Discharge Summary Reason for Admission: onset of labor Prenatal Procedures: none Intrapartum Procedures: spontaneous vaginal delivery Postpartum Procedures: Rho(D) Ig Complications-Operative and Postpartum: none HEMOGLOBIN  Date Value Ref Range Status  11/23/2013 10.0* 12.0 - 15.0 g/dL Final   HCT  Date Value Ref Range Status  11/23/2013 30.2* 36.0 - 46.0 % Final    Physical Exam:  General: alert, cooperative and no distress Lochia: appropriate Uterine Fundus: firm DVT Evaluation: No evidence of DVT seen on physical exam.  Discharge Diagnoses: Term Pregnancy-delivered  Discharge Information: Date: 11/24/2013 Activity: pelvic rest Diet: routine Medications: PNV, Ibuprofen and Synthroid Condition: stable Instructions: refer to practice specific booklet Discharge to: home Follow-up Information    Follow up with Almon HerculesOSS,KENDRA H., MD. Call in 4 weeks.   Specialty:  Obstetrics and Gynecology   Contact information:   [address] [place] [id number] [phone]       Newborn Data: Live born female  Birth Weight: 7 lb 7 oz (3374 g) APGAR: 9, 9  Home with mother.  Marlow BaarsCLARK, Levy Wellman 11/24/2013, 8:33 AM

## 2013-11-24 NOTE — Lactation Note (Signed)
This note was copied from the chart of Alexis Rodena GoldmannJennifer Musich. Lactation Consultation Note Mom has changed to total bottle feeding. Hasn't bottle fed since 11/22/2013.  Patient Name: Alexis Rodena GoldmannJennifer Calzada UJWJX'BToday's Date: 11/24/2013 Reason for consult: Follow-up assessment   Maternal Data    Feeding Feeding Type: Bottle Fed - Formula Nipple Type: Slow - flow  LATCH Score/Interventions                      Lactation Tools Discussed/Used     Consult Status Consult Status: Complete Date: 11/24/13 Follow-up type: Call as needed    Bria Sparr, Diamond NickelLAURA G 11/24/2013, 7:24 AM

## 2013-12-25 ENCOUNTER — Emergency Department (HOSPITAL_BASED_OUTPATIENT_CLINIC_OR_DEPARTMENT_OTHER)
Admission: EM | Admit: 2013-12-25 | Discharge: 2013-12-25 | Disposition: A | Discharge status: Home / Self Care | Payer: 59 | Attending: Emergency Medicine | Admitting: Emergency Medicine

## 2013-12-25 ENCOUNTER — Encounter (HOSPITAL_BASED_OUTPATIENT_CLINIC_OR_DEPARTMENT_OTHER): Payer: Self-pay | Admitting: Emergency Medicine

## 2013-12-25 ENCOUNTER — Emergency Department (HOSPITAL_BASED_OUTPATIENT_CLINIC_OR_DEPARTMENT_OTHER): Payer: 59

## 2013-12-25 DIAGNOSIS — Z9851 Tubal ligation status: Secondary | ICD-10-CM | POA: Insufficient documentation

## 2013-12-25 DIAGNOSIS — Z3202 Encounter for pregnancy test, result negative: Secondary | ICD-10-CM | POA: Insufficient documentation

## 2013-12-25 DIAGNOSIS — Z79899 Other long term (current) drug therapy: Secondary | ICD-10-CM | POA: Diagnosis not present

## 2013-12-25 DIAGNOSIS — G8929 Other chronic pain: Secondary | ICD-10-CM | POA: Insufficient documentation

## 2013-12-25 DIAGNOSIS — E039 Hypothyroidism, unspecified: Secondary | ICD-10-CM | POA: Insufficient documentation

## 2013-12-25 DIAGNOSIS — Z87891 Personal history of nicotine dependence: Secondary | ICD-10-CM | POA: Diagnosis not present

## 2013-12-25 DIAGNOSIS — F419 Anxiety disorder, unspecified: Secondary | ICD-10-CM | POA: Insufficient documentation

## 2013-12-25 DIAGNOSIS — R1032 Left lower quadrant pain: Secondary | ICD-10-CM | POA: Diagnosis present

## 2013-12-25 DIAGNOSIS — N200 Calculus of kidney: Secondary | ICD-10-CM | POA: Diagnosis not present

## 2013-12-25 LAB — CBC WITH DIFFERENTIAL/PLATELET
BASOS PCT: 0 % (ref 0–1)
Basophils Absolute: 0 10*3/uL (ref 0.0–0.1)
Eosinophils Absolute: 0.2 10*3/uL (ref 0.0–0.7)
Eosinophils Relative: 2 % (ref 0–5)
HEMATOCRIT: 38.6 % (ref 36.0–46.0)
Hemoglobin: 12.7 g/dL (ref 12.0–15.0)
LYMPHS PCT: 40 % (ref 12–46)
Lymphs Abs: 3.2 10*3/uL (ref 0.7–4.0)
MCH: 27.5 pg (ref 26.0–34.0)
MCHC: 32.9 g/dL (ref 30.0–36.0)
MCV: 83.5 fL (ref 78.0–100.0)
MONO ABS: 0.6 10*3/uL (ref 0.1–1.0)
Monocytes Relative: 7 % (ref 3–12)
NEUTROS ABS: 3.9 10*3/uL (ref 1.7–7.7)
NEUTROS PCT: 51 % (ref 43–77)
PLATELETS: 186 10*3/uL (ref 150–400)
RBC: 4.62 MIL/uL (ref 3.87–5.11)
RDW: 13.9 % (ref 11.5–15.5)
WBC: 7.8 10*3/uL (ref 4.0–10.5)

## 2013-12-25 LAB — URINALYSIS, ROUTINE W REFLEX MICROSCOPIC
Bilirubin Urine: NEGATIVE
Glucose, UA: NEGATIVE mg/dL
Ketones, ur: NEGATIVE mg/dL
Leukocytes, UA: NEGATIVE
Nitrite: NEGATIVE
Protein, ur: NEGATIVE mg/dL
Specific Gravity, Urine: 1.023 (ref 1.005–1.030)
UROBILINOGEN UA: 0.2 mg/dL (ref 0.0–1.0)
pH: 5.5 (ref 5.0–8.0)

## 2013-12-25 LAB — URINE MICROSCOPIC-ADD ON

## 2013-12-25 LAB — COMPREHENSIVE METABOLIC PANEL
ALBUMIN: 3.9 g/dL (ref 3.5–5.2)
ALT: 22 U/L (ref 0–35)
ANION GAP: 14 (ref 5–15)
AST: 20 U/L (ref 0–37)
Alkaline Phosphatase: 100 U/L (ref 39–117)
BILIRUBIN TOTAL: 0.3 mg/dL (ref 0.3–1.2)
BUN: 9 mg/dL (ref 6–23)
CHLORIDE: 103 meq/L (ref 96–112)
CO2: 25 mEq/L (ref 19–32)
CREATININE: 1 mg/dL (ref 0.50–1.10)
Calcium: 9.6 mg/dL (ref 8.4–10.5)
GFR calc non Af Amer: 71 mL/min — ABNORMAL LOW (ref >90)
GFR, EST AFRICAN AMERICAN: 82 mL/min — AB (ref >90)
GLUCOSE: 107 mg/dL — AB (ref 70–99)
Potassium: 3.8 mEq/L (ref 3.7–5.3)
Sodium: 142 mEq/L (ref 137–147)
Total Protein: 7.2 g/dL (ref 6.0–8.3)

## 2013-12-25 LAB — HCG, QUANTITATIVE, PREGNANCY: hCG, Beta Chain, Quant, S: <1 m[IU]/mL (ref <5)

## 2013-12-25 LAB — PREGNANCY, URINE: PREG TEST UR: NEGATIVE

## 2013-12-25 MED ORDER — ONDANSETRON 4 MG PO TBDP: 4.0000 mg | ORAL_TABLET | ORAL | Status: DC | PRN

## 2013-12-25 MED ORDER — SODIUM CHLORIDE 0.9 % IV SOLN
Freq: Once | INTRAVENOUS | Status: AC
  Administered 2013-12-25: 21:00:00 via INTRAVENOUS

## 2013-12-25 MED ORDER — OXYCODONE-ACETAMINOPHEN 5-325 MG PO TABS: 2.0000 | ORAL_TABLET | ORAL | Status: DC | PRN

## 2013-12-25 MED ORDER — IBUPROFEN 800 MG PO TABS: 800.0000 mg | ORAL_TABLET | Freq: Three times a day (TID) | ORAL | Status: DC

## 2013-12-25 MED ORDER — ONDANSETRON HCL 4 MG/2ML IJ SOLN
4.0000 mg | Freq: Once | INTRAMUSCULAR | Status: AC
  Administered 2013-12-25: 4 mg via INTRAVENOUS
  Filled 2013-12-25: qty 2

## 2013-12-25 MED ORDER — HYDROMORPHONE HCL 1 MG/ML IJ SOLN
1.0000 mg | Freq: Once | INTRAMUSCULAR | Status: AC
Start: 2013-12-25 — End: 2013-12-25
  Administered 2013-12-25: 1 mg via INTRAVENOUS
  Filled 2013-12-25: qty 1

## 2013-12-25 NOTE — ED Notes (Signed)
Patient states that she has had let flank pain radiating into her left lower quad area for about 2 hours with vomiting

## 2013-12-25 NOTE — ED Provider Notes (Signed)
CSN: [id number]     Arrival date & time 12/25/13  2043 History   First MD Initiated Contact with Patient 12/25/13 2056     Chief Complaint  Patient presents with  . Flank Pain     (Consider location/radiation/quality/duration/timing/severity/associated sxs/prior Treatment) HPI  The patient sudden onset of left flank pain. History of present for approximately 2 hours. She reports this intense and severe and radiates to her left lower quadrant. She reports associated nausea vomiting. She has never had similar pain. The patient had a baby about a month ago. She reports she's been healing well since that time and not been having any significant difficulties. She for she still has a small amount of vaginal spotting. She denies that she has been sexually active since the delivery of the baby. She reports she was feeling well right upper until onset of pain.  Past Medical History  Diagnosis Date  . Thyroid disease     hypothyroid  . Anxiety   . Hypothyroidism   . Chronic back pain    Past Surgical History  Procedure Laterality Date  . Tubual reversal    . Tubal ligation    . Leg surgery     Family History  Problem Relation Age of Onset  . Hypertension Father    History  Substance Use Topics  . Smoking status: Former Smoker    Quit date: 09/15/2000  . Smokeless tobacco: Never Used  . Alcohol Use: Yes     Comment: occassional before pregnancy   OB History    Gravida Para Term Preterm AB TAB SAB Ectopic Multiple Living   5 5 4 1      5      Review of Systems 10 Systems reviewed and are negative for acute change except as noted in the HPI.    Allergies  Review of patient's allergies indicates no known allergies.  Home Medications   Prior to Admission medications   Medication Sig Start Date End Date Taking? Authorizing Provider  buPROPion (WELLBUTRIN XL) 150 MG 24 hr tablet Take 150 mg by mouth daily.    Historical Provider, MD  calcium carbonate (TUMS - DOSED IN MG  ELEMENTAL CALCIUM) 500 MG chewable tablet Chew 1 tablet by mouth daily as needed for indigestion or heartburn.    Historical Provider, MD  ibuprofen (ADVIL,MOTRIN) 600 MG tablet Take 1 tablet (600 mg total) by mouth every 6 (six) hours as needed for moderate pain. 11/24/13   Marlow Baars, MD  ibuprofen (ADVIL,MOTRIN) 800 MG tablet Take 1 tablet (800 mg total) by mouth 3 (three) times daily. 12/25/13   Arby Barrette, MD  levothyroxine (SYNTHROID, LEVOTHROID) 75 MCG tablet Take 75 mcg by mouth daily before breakfast.    Historical Provider, MD  ondansetron (ZOFRAN ODT) 4 MG disintegrating tablet Take 1 tablet (4 mg total) by mouth every 4 (four) hours as needed for nausea or vomiting. 12/25/13   Arby Barrette, MD  oxyCODONE-acetaminophen (PERCOCET) 5-325 MG per tablet Take 2 tablets by mouth every 4 (four) hours as needed. 12/25/13   Arby Barrette, MD  Prenatal Vit-Fe Fumarate-FA (PRENATAL MULTIVITAMIN) TABS tablet Take 1 tablet by mouth at bedtime.     Historical Provider, MD   BP 109/53 mmHg  Pulse 79  Temp(Src) 98.7 F (37.1 C) (Oral)  Resp 18  Ht 5\' 4"  (1.626 m)  Wt 200 lb (90.719 kg)  BMI 34.31 kg/m2  SpO2 96%  Breastfeeding? No Physical Exam  Constitutional: She is oriented to person, place, and  time. She appears well-developed and well-nourished.  The patient is nontoxic and alert but appears to be in severe pain.  HENT:  Head: Normocephalic and atraumatic.  Eyes: EOM are normal.  Neck: Neck supple.  Cardiovascular: Normal rate, regular rhythm, normal heart sounds and intact distal pulses.   Pulmonary/Chest: Effort normal and breath sounds normal. No respiratory distress. She has no wheezes. She has no rales.  Abdominal: Soft. Bowel sounds are normal. She exhibits no distension and no mass. There is no tenderness. There is no rebound and no guarding.  Musculoskeletal: Normal range of motion. She exhibits no edema or tenderness.  No CVA tenderness  Neurological: She is alert and  oriented to person, place, and time. No cranial nerve deficit. Coordination normal.  Skin: Skin is warm and dry.  Psychiatric: She has a normal mood and affect.  Patient is in pain but appropriate.       ED Course  Procedures (including critical care time) Labs Review Labs Reviewed  URINALYSIS, ROUTINE W REFLEX MICROSCOPIC - Abnormal; Notable for the following:    APPearance CLOUDY (*)    Hgb urine dipstick LARGE (*)    All other components within normal limits  COMPREHENSIVE METABOLIC PANEL - Abnormal; Notable for the following:    Glucose, Bld 107 (*)    GFR calc non Af Amer 71 (*)    GFR calc Af Amer 82 (*)    All other components within normal limits  URINE MICROSCOPIC-ADD ON - Abnormal; Notable for the following:    Bacteria, UA MANY (*)    All other components within normal limits  PREGNANCY, URINE  HCG, QUANTITATIVE, PREGNANCY  CBC WITH DIFFERENTIAL    Imaging Review Ct Renal Stone Study  12/25/2013   CLINICAL DATA:  Acute onset of left flank pain, nausea and vomiting. Initial encounter.  EXAM: CT ABDOMEN AND PELVIS WITHOUT CONTRAST  TECHNIQUE: Multidetector CT imaging of the abdomen and pelvis was performed following the standard protocol without IV contrast.  COMPARISON:  CT of the lumbar spine performed 08/20/2009  FINDINGS: The visualized lung bases are clear.  The liver and spleen are unremarkable in appearance. The gallbladder is within normal limits. The pancreas and adrenal glands are unremarkable.  There is minimal left-sided hydronephrosis, with prominence of the left ureter along its entire course, and an obstructing 4 mm stone distally at the left vesicoureteral junction, along the base of the bladder. Mild left-sided perinephric stranding is seen. The right kidney is unremarkable in appearance. No nonobstructing renal stones are identified. There is incomplete rotation of the left kidney.  No free fluid is identified. The small bowel is unremarkable in appearance.  The stomach is within normal limits. No acute vascular abnormalities are seen.  The appendix is normal in caliber, without evidence for appendicitis. The colon is grossly unremarkable.  The bladder is mildly distended and grossly remarkable in appearance. The uterus is within normal limits. The ovaries are relatively symmetric. No suspicious adnexal masses are seen. No inguinal lymphadenopathy is seen.  No acute osseous abnormalities are identified. There is a chronic healing fracture of the right posterior eleventh rib.  IMPRESSION: Minimal left-sided hydronephrosis, with an obstructing 4 mm stone noted distally at the left vesicoureteral junction, along the base of the bladder.   Electronically Signed   By: Roanna RaiderJeffery  Chang M.D.   On: 12/25/2013 23:00     EKG Interpretation None      MDM   Final diagnoses:  Kidney stones   4 mm  kidney stone is confirmed. The patient has achieved good pain control emergency department. At this point she will be discharged with pain control medications and a plan for urology follow-up. She is given signs and symptoms for which return.    Arby BarretteMarcy Sasha Rueth, MD 12/26/13 1556

## 2013-12-25 NOTE — Discharge Instructions (Signed)
Kidney Stones °Kidney stones (urolithiasis) are deposits that form inside your kidneys. The intense pain is caused by the stone moving through the urinary tract. When the stone moves, the ureter goes into spasm around the stone. The stone is usually passed in the urine.  °CAUSES  °· A disorder that makes certain neck glands produce too much parathyroid hormone (primary hyperparathyroidism). °· A buildup of uric acid crystals, similar to gout in your joints. °· Narrowing (stricture) of the ureter. °· A kidney obstruction present at birth (congenital obstruction). °· Previous surgery on the kidney or ureters. °· Numerous kidney infections. °SYMPTOMS  °· Feeling sick to your stomach (nauseous). °· Throwing up (vomiting). °· Blood in the urine (hematuria). °· Pain that usually spreads (radiates) to the groin. °· Frequency or urgency of urination. °DIAGNOSIS  °· Taking a history and physical exam. °· Blood or urine tests. °· CT scan. °· Occasionally, an examination of the inside of the urinary bladder (cystoscopy) is performed. °TREATMENT  °· Observation. °· Increasing your fluid intake. °· Extracorporeal shock wave lithotripsy--This is a noninvasive procedure that uses shock waves to break up kidney stones. °· Surgery may be needed if you have severe pain or persistent obstruction. There are various surgical procedures. Most of the procedures are performed with the use of small instruments. Only small incisions are needed to accommodate these instruments, so recovery time is minimized. °The size, location, and chemical composition are all important variables that will determine the proper choice of action for you. Talk to your health care provider to better understand your situation so that you will minimize the risk of injury to yourself and your kidney.  °HOME CARE INSTRUCTIONS  °· Drink enough water and fluids to keep your urine clear or pale yellow. This will help you to pass the stone or stone fragments. °· Strain  all urine through the provided strainer. Keep all particulate matter and stones for your health care provider to see. The stone causing the pain may be as small as a grain of salt. It is very important to use the strainer each and every time you pass your urine. The collection of your stone will allow your health care provider to analyze it and verify that a stone has actually passed. The stone analysis will often identify what you can do to reduce the incidence of recurrences. °· Only take over-the-counter or prescription medicines for pain, discomfort, or fever as directed by your health care provider. °· Make a follow-up appointment with your health care provider as directed. °· Get follow-up X-rays if required. The absence of pain does not always mean that the stone has passed. It may have only stopped moving. If the urine remains completely obstructed, it can cause loss of kidney function or even complete destruction of the kidney. It is your responsibility to make sure X-rays and follow-ups are completed. Ultrasounds of the kidney can show blockages and the status of the kidney. Ultrasounds are not associated with any radiation and can be performed easily in a matter of minutes. °SEEK MEDICAL CARE IF: °· You experience pain that is progressive and unresponsive to any pain medicine you have been prescribed. °SEEK IMMEDIATE MEDICAL CARE IF:  °· Pain cannot be controlled with the prescribed medicine. °· You have a fever or shaking chills. °· The severity or intensity of pain increases over 18 hours and is not relieved by pain medicine. °· You develop a new onset of abdominal pain. °· You feel faint or pass out. °·   You are unable to urinate. °MAKE SURE YOU:  °· Understand these instructions. °· Will watch your condition. °· Will get help right away if you are not doing well or get worse. °Document Released: 01/09/2005 Document Revised: 09/11/2012 Document Reviewed: 06/12/2012 °ExitCare® Patient Information ©2015  [employer]. This information is not intended to replace advice given to you by your health care provider. Make sure you discuss any questions you have with your health care provider. ° ° ° °Emergency Department Resource Guide °1) Find a Doctor and Pay Out of Pocket °Although you won't have to find out who is covered by your insurance plan, it is a good idea to ask around and get recommendations. You will then need to call the office and see if the doctor you have chosen will accept you as a new patient and what types of options they offer for patients who are self-pay. Some doctors offer discounts or will set up payment plans for their patients who do not have insurance, but you will need to ask so you aren't surprised when you get to your appointment. ° °2) Contact Your Local Health Department °Not all health departments have doctors that can see patients for sick visits, but many do, so it is worth a call to see if yours does. If you don't know where your local health department is, you can check in your phone book. The CDC also has a tool to help you locate your state's health department, and many state websites also have listings of all of their local health departments. ° °3) Find a Walk-in Clinic °If your illness is not likely to be very severe or complicated, you may want to try a walk in clinic. These are popping up all over the country in pharmacies, drugstores, and shopping centers. They're usually staffed by nurse practitioners or physician assistants that have been trained to treat common illnesses and complaints. They're usually fairly quick and inexpensive. However, if you have serious medical issues or chronic medical problems, these are probably not your best option. ° °No Primary Care Doctor: °- Call [facility] at  832-8000 - they can help you locate a primary care doctor that  accepts your insurance, provides certain services, etc. °- Physician Referral Service- 1-800-533-3463 ° °Chronic  Pain Problems: °Organization         Address  Phone   Notes  °[facility]  (336) 297-2271 Patients need to be referred by their primary care doctor.  ° °Medication Assistance: °Organization         Address  Phone   Notes  °[place] [facility] [address] °[place], [place] [address] (336) 641-8030 --Must be a resident of [place] °-- Must have NO insurance coverage whatsoever (no Medicaid/ Medicare, etc.) °-- The pt. MUST have a primary care doctor that directs their care regularly and follows them in the community °  °[facility]  (866) 331-1348   °[employer]  (888) 892-1162   ° °Agencies that provide inexpensive medical care: °Organization         Address  Phone   Notes  °[facility]  (336) 832-8035   °[facility]    (336) 832-7272   °[facility] [address] °[place], [place] [address] (336) 832-4777   °[facility] [address], °[place] (336) 271-4999   °[facility]    (336) 373-0678   °[facility]    (  336) 272-1050   °[facility] ° [address], [place] Phone:  (336) 832-4444, Fax:  (336) 832-4440 Hours of Operation:  9 am - 6 pm, M-F.  Also accepts Medicaid/Medicare and self-pay.  °[facility] ° [address], [place] Phone: (336) 832-3150, Fax: (336) 832-3151. Hours of Operation:  8:30 am - 5:30 pm, M-F.  Also accepts Medicaid and self-pay.  °[facility] [place] [address], [place] Phone: (336) 878-6027   °[facility] [address], [place], [place] (336)723-1848, Ext. 123 Mondays & Thursdays: 7-9 AM.  First 15 patients are seen on a first come, first serve basis. °  ° °Medicaid-accepting [place] Providers: ° °Organization         Address  Phone   Notes  °[facility] [address], [place] (336) 641-2100 Also  accepts self-pay patients.  °[facility] [address], [place] ° (336) 856-9996   °[facility] [address], [place] (336) 288-8857   °[facility] [address] [place] [address], [place] (336) 299-7000   °Veita Bland [address], [place]  ° (336) 373-1557 Only accepts [place] Access Medicaid patients after they have their name applied to their card.  ° °Self-Pay (no insurance) in [place]: ° °Organization         Address  Phone   Notes  °Sickle Cell Patients, [facility] [address], [place] (336) 832-1970   °[facility] [address], [place] (336) 832-4400   °[facility] ° [address], [place] (336) 992-4800   °[facility]/Dr. Osei-Bonsu ° [address] [place] [address], [place] or [address], [place] (336) 841-8500 Phone number for both [place] and [place] locations is the same.  °[facility] [address], [place] (336) 299-0000   °[facility] [address] [place] [address], [place] or [address] (336) 852-7530 °(336) 878-2260   °[facility] [address], [place] (336) 350-1642, phone; (336) 294-5005, fax Sees patients 1st and 3rd Saturday of every month.  Must not qualify for public or private insurance (i.e. Medicaid, Medicare, [place] Health Choice, Veterans' Benefits) • Household income should be no more than 200% of the poverty level •The clinic cannot treat you if you are pregnant or think you are pregnant • Sexually transmitted diseases are not treated at the clinic.  ° ° °Dental Care: °Organization         Address  Phone  Notes  °[facility] [address], [place] (336) 641-6152 Accepts children up to age 21 who are enrolled in Medicaid or [place] Health Choice; pregnant  women with a Medicaid card; and children who have applied for Medicaid or [place] Health Choice, but were declined, whose parents can pay a reduced fee at time of service.  °[facility] [place]  [address], [place] (336) 641-7733 Accepts children up to age 21 who are enrolled in Medicaid or [place] Health Choice; pregnant women with a Medicaid card; and children who have applied for Medicaid or [place] Health Choice, but were declined, whose parents can pay a reduced fee at time of service.  °[facility] ° [address], [place] (336) 641-4533 Patients are seen by appointment only. Walk-ins are not accepted. [facility] will see patients 18 years of age and older. °Monday - Tuesday (8am-5pm) °Most Wednesdays (8:30-5pm) °$30 per visit, cash only  °[facility] ° [address], [place] (336) 641-4533 Patients are seen by appointment only. Walk-ins are not accepted. [facility] will see patients 18 years of age and older. °One Wednesday Evening (Monthly: Volunteer Based).  $30 per visit, cash only  °[facility]  (919) 537-3737 for adults; Children under age 4, call Graduate Pediatric Dentistry at ([phone]) [phone]. Children aged 4-14, please call (919) [phone] to request a pediatric application. ° Dental services are provided in all areas of dental care including fillings, crowns and bridges, complete and partial dentures, implants, gum treatment, root canals, and extractions. Preventive care is also provided. Treatment is provided to both adults and children. °Patients are selected via a lottery and there is often a waiting list. °  °[facility] [address], °[place] ° (336) 763-8833 [email or web address] °  °[facility] [address], [place], [place] (336)723-1848, Ext. 123 Second and Fourth Thursday of each month, opens at 6:30 AM; Clinic ends at 9 AM.  Patients are  seen on a first-come first-served basis, and a limited number are seen during each clinic.  ° °[facility] ° [address], [place], [place] (336) 723-7904   Eligibility Requirements °You must have lived in [place], [place], or [place] counties for at least the last three months. °  You cannot be eligible for state or federal sponsored healthcare insurance, including Veterans Administration, Medicaid, or Medicare. °  You generally cannot be eligible for healthcare insurance through your employer.  °  How to apply: °Eligibility screenings are held every Tuesday and Wednesday afternoon from 1:00 pm until 4:00 pm. You do not need an appointment for the interview!  °[facility] [address], [place], [place] 336-631-2330   °[facility]  336-342-8273   °[facility]  336-703-3100   °[facility]  336-570-6415   ° °Behavioral Health Resources in the Community: °Intensive Outpatient Programs °Organization         Address  Phone  Notes  °[facility] [address], [place], [place] 336-878-6098   °[facility] [address], [place], [place] 336-832-9800   °[facility] [address], [place], [place] ° 336-882-2125   °[facility] [address],  °[place],  1-800-853-5163 or 336-641-4981   °Substance Abuse Resources °Organization         Address  Phone  Notes  °[facility]  336-882-2125   °[facility]  336-784-9470   °[facility]  336-285-9073   °[facility]  336-845-3988   °Residential & Outpatient Substance Abuse Program  1-800-659-3381   °Psychological Services °Organization         Address  Phone  Notes  °[facility]  336- 832-9600   °[facility]  336- 378-7881   °[facility] [address],  1-800-853-5163 or 336-641-4981   ° °Mobile Crisis  Teams °Organization         Address  Phone  Notes  °[facility], Mobile Crisis Care Unit  1-877-626-1772   °Assertive °[facility] ° [address] ,   [place] 336-834-9664   °Sharon DeEsch [address] °[place] 336-554-5454   ° °Self-Help/Support Groups °Organization         Address  Phone             Notes  °[facility] - variety of support groups  336- 373-1402 Call for more information  °Narcotics Anonymous (NA), [facility] [address], °[place]  2 meetings at this location  ° °Residential Treatment Programs °Organization         Address  Phone  Notes  °[facility] [address],    °[place]  1-866-801-8205   °[facility] ° [address], [place], [place] 704-293-8524   °[facility] [address], [place] 336-845-3988 Admissions: 8am-3pm M-F  °[facility] [address]    °[place], [place] 336-841-1104   °[facility] [address], [place], [place] 336-379-7146   °[facility] [address]  °[place], [place] 336-285-9073   °[facility] - Intensive Outpatient [address], [place], [place] 336-852-3033   °[facility] [address]  °[place], [place] 1-877-615-2722 or 336-784-9470   °[facility] [address] [place], [place] 336-227-7417 Accepts Medicaid  °[facility] [address]  °[place] 1-800-659-3381 Substance Abuse/Addiction Treatment  ° °[place] Behavioral Health Resources °Organization         Address  Phone  Notes  °[facility]  (888) 581-9988   °Julie Brannon, PhD [address] [place], [place]   (336) 349-5553 or (336) 951-0000   °[facility]   [address] °[place], [place] (336) 349-4454   °[facility] [address], [place], [place] (336) 342-8316  Insurance/Medicaid/sponsorship through [facility]  °[facility] [address]                                    [place], [place] (336) 342-8316 Therapy/tele-psych/case  °[facility] [address]  ° [place],  (336) 349-2233    °Dr. Arfeen  (336) 349-4544   °[facility] [place]  [employer] [place] [facility] 1) [address], [place] °2) [address], [place] °3)  [address], [place] (336) 349-3220 °(336) 342-7768 ° °(336) 342-8140   °[place] [facility] (336) 342-1394 or (336) 342-3537 (After Hours)    ° ° ° °

## 2013-12-25 NOTE — ED Notes (Signed)
Pt unable to sign  Computer diff

## 2013-12-25 NOTE — ED Notes (Signed)
Sudden onset left flank pain 2 hrs ago w n/v

## 2014-05-03 ENCOUNTER — Emergency Department (HOSPITAL_BASED_OUTPATIENT_CLINIC_OR_DEPARTMENT_OTHER): Payer: 59

## 2014-05-03 ENCOUNTER — Emergency Department (HOSPITAL_BASED_OUTPATIENT_CLINIC_OR_DEPARTMENT_OTHER)
Admission: EM | Admit: 2014-05-03 | Discharge: 2014-05-03 | Disposition: A | Discharge status: Home / Self Care | Payer: 59 | Attending: Emergency Medicine | Admitting: Emergency Medicine

## 2014-05-03 ENCOUNTER — Encounter (HOSPITAL_BASED_OUTPATIENT_CLINIC_OR_DEPARTMENT_OTHER): Payer: Self-pay | Admitting: *Deleted

## 2014-05-03 DIAGNOSIS — E079 Disorder of thyroid, unspecified: Secondary | ICD-10-CM | POA: Insufficient documentation

## 2014-05-03 DIAGNOSIS — F419 Anxiety disorder, unspecified: Secondary | ICD-10-CM | POA: Insufficient documentation

## 2014-05-03 DIAGNOSIS — G8929 Other chronic pain: Secondary | ICD-10-CM | POA: Diagnosis not present

## 2014-05-03 DIAGNOSIS — Z87891 Personal history of nicotine dependence: Secondary | ICD-10-CM | POA: Insufficient documentation

## 2014-05-03 DIAGNOSIS — R51 Headache: Secondary | ICD-10-CM | POA: Diagnosis not present

## 2014-05-03 DIAGNOSIS — R519 Headache, unspecified: Secondary | ICD-10-CM

## 2014-05-03 DIAGNOSIS — Z79899 Other long term (current) drug therapy: Secondary | ICD-10-CM | POA: Diagnosis not present

## 2014-05-03 NOTE — ED Provider Notes (Signed)
CSN: [id number]     Arrival date & time 05/03/14  4098 History   First MD Initiated Contact with Patient 05/03/14 1023     Chief Complaint  Patient presents with  . Headache     (Consider location/radiation/quality/duration/timing/severity/associated sxs/prior Treatment) HPI Comments: Patient is a 39 year old female who presents for evaluation of headaches. She states that these have been going on for the past 5 days. Her headache will migrate to different parts of her head and she describes it as a sharp, stabbing pain. She denies any visual disturbances. She does admit to some left arm numbness.  Patient is a 39 y.o. female presenting with headaches. The history is provided by the patient.  Headache Pain location:  Generalized Quality:  Stabbing Radiates to:  Does not radiate Onset quality:  Sudden Duration:  5 days Timing:  Intermittent Progression:  Unchanged Chronicity:  New Similar to prior headaches: no   Context: caffeine   Context: not activity and not exposure to bright light   Relieved by:  Nothing Worsened by:  Nothing Ineffective treatments:  None tried Associated symptoms: no blurred vision, no congestion, no facial pain, no fatigue, no fever and no visual change     Past Medical History  Diagnosis Date  . Thyroid disease     hypothyroid  . Anxiety   . Hypothyroidism   . Chronic back pain    Past Surgical History  Procedure Laterality Date  . Tubual reversal    . Tubal ligation    . Leg surgery     Family History  Problem Relation Age of Onset  . Hypertension Father    History  Substance Use Topics  . Smoking status: Former Smoker    Quit date: 09/15/2000  . Smokeless tobacco: Never Used  . Alcohol Use: No     Comment: occassional before pregnancy   OB History    Gravida Para Term Preterm AB TAB SAB Ectopic Multiple Living   Review of Systems  Constitutional: Negative for fever and fatigue.  HENT: Negative for  congestion.   Eyes: Negative for blurred vision.  Neurological: Positive for headaches.  All other systems reviewed and are negative.     Allergies  Review of patient's allergies indicates no known allergies.  Home Medications   Prior to Admission medications   Medication Sig Start Date End Date Taking? Authorizing Provider  levothyroxine (SYNTHROID, LEVOTHROID) 75 MCG tablet Take 75 mcg by mouth daily before breakfast.   Yes Historical Provider, MD  buPROPion (WELLBUTRIN XL) 150 MG 24 hr tablet Take 150 mg by mouth daily.    Historical Provider, MD  calcium carbonate (TUMS - DOSED IN MG ELEMENTAL CALCIUM) 500 MG chewable tablet Chew 1 tablet by mouth daily as needed for indigestion or heartburn.    Historical Provider, MD  ibuprofen (ADVIL,MOTRIN) 600 MG tablet Take 1 tablet (600 mg total) by mouth every 6 (six) hours as needed for moderate pain. 11/24/13   Marlow Baars, MD  ibuprofen (ADVIL,MOTRIN) 800 MG tablet Take 1 tablet (800 mg total) by mouth 3 (three) times daily. 12/25/13   Arby Barrette, MD  ondansetron (ZOFRAN ODT) 4 MG disintegrating tablet Take 1 tablet (4 mg total) by mouth every 4 (four) hours as needed for nausea or vomiting. 12/25/13   Arby Barrette, MD  oxyCODONE-acetaminophen (PERCOCET) 5-325 MG per tablet Take 2 tablets by mouth every 4 (four) hours as needed. 12/25/13  Arby BarretteMarcy Pfeiffer, MD  Prenatal Vit-Fe Fumarate-FA (PRENATAL MULTIVITAMIN) TABS tablet Take 1 tablet by mouth at bedtime.     Historical Provider, MD   BP 143/75 mmHg  Pulse 74  Temp(Src) 98 F (36.7 C)  Resp 16  Ht 5\' 4"  (1.626 m)  Wt 195 lb (88.451 kg)  BMI 33.46 kg/m2  SpO2 100%  LMP 04/24/2014 Physical Exam  Constitutional: She is oriented to person, place, and time. She appears well-developed and well-nourished. No distress.  HENT:  Head: Normocephalic and atraumatic.  Eyes: EOM are normal. Pupils are equal, round, and reactive to light.  There is no papilledema on funduscopic exam.   Neck: Normal range of motion. Neck supple.  Cardiovascular: Normal rate and regular rhythm.  Exam reveals no gallop and no friction rub.   No murmur heard. Pulmonary/Chest: Effort normal and breath sounds normal. No respiratory distress. She has no wheezes.  Abdominal: Soft. Bowel sounds are normal. She exhibits no distension. There is no tenderness.  Musculoskeletal: Normal range of motion.  Neurological: She is alert and oriented to person, place, and time. No cranial nerve deficit. She exhibits normal muscle tone. Coordination normal.  Skin: Skin is warm and dry. She is not diaphoretic.  Nursing note and vitals reviewed.   ED Course  Procedures (including critical care time) Labs Review Labs Reviewed - No data to display  Imaging Review No results found.   EKG Interpretation None      MDM   Final diagnoses:  None    CT scan is negative and neurologic exam is nonfocal. I doubt any emergent process. I feel as though she is appropriate for discharge with when necessary follow-up.    Geoffery Lyonsouglas Daymian Lill, MD 05/03/14 1228

## 2014-05-03 NOTE — Discharge Instructions (Signed)
Ibuprofen 600 mg rotated with Tylenol 1000 mg every 4 hours as needed for pain.  Try to limit your caffeine intake.  Follow-up with your primary Dr. if not improving in the next 1-2 weeks, and return to the ER if your symptoms significantly worsen or change.   General Headache Without Cause A headache is pain or discomfort felt around the head or neck area. The specific cause of a headache may not be found. There are many causes and types of headaches. A few common ones are:  Tension headaches.  Migraine headaches.  Cluster headaches.  Chronic daily headaches. HOME CARE INSTRUCTIONS   Keep all follow-up appointments with your caregiver or any specialist referral.  Only take over-the-counter or prescription medicines for pain or discomfort as directed by your caregiver.  Lie down in a dark, quiet room when you have a headache.  Keep a headache journal to find out what may trigger your migraine headaches. For example, write down:  What you eat and drink.  How much sleep you get.  Any change to your diet or medicines.  Try massage or other relaxation techniques.  Put ice packs or heat on the head and neck. Use these 3 to 4 times per day for 15 to 20 minutes each time, or as needed.  Limit stress.  Sit up straight, and do not tense your muscles.  Quit smoking if you smoke.  Limit alcohol use.  Decrease the amount of caffeine you drink, or stop drinking caffeine.  Eat and sleep on a regular schedule.  Get 7 to 9 hours of sleep, or as recommended by your caregiver.  Keep lights dim if bright lights bother you and make your headaches worse. SEEK MEDICAL CARE IF:   You have problems with the medicines you were prescribed.  Your medicines are not working.  You have a change from the usual headache.  You have nausea or vomiting. SEEK IMMEDIATE MEDICAL CARE IF:   Your headache becomes severe.  You have a fever.  You have a stiff neck.  You have loss of  vision.  You have muscular weakness or loss of muscle control.  You start losing your balance or have trouble walking.  You feel faint or pass out.  You have severe symptoms that are different from your first symptoms. MAKE SURE YOU:   Understand these instructions.  Will watch your condition.  Will get help right away if you are not doing well or get worse. Document Released: 01/09/2005 Document Revised: 04/03/2011 Document Reviewed: 01/25/2011 [facility] Patient Information 2015 [place], [place]. This information is not intended to replace advice given to you by your health care provider. Make sure you discuss any questions you have with your health care provider.

## 2014-05-03 NOTE — ED Notes (Signed)
Pt reports headaches since Tuesday (no hx of migraines). Reports they are transient and inconsistent -- occur in different places on head, at random times, with associated nausea. Denies v/d. Reports Excedrin, Motrin, Aleve bring no relief. Also reports intermittent L arm weakness at times. Danna HeftyGolden, Taliesin Hartlage Lee, RN

## 2014-11-09 ENCOUNTER — Emergency Department (HOSPITAL_BASED_OUTPATIENT_CLINIC_OR_DEPARTMENT_OTHER)
Admission: EM | Admit: 2014-11-09 | Discharge: 2014-11-09 | Disposition: A | Discharge status: Home / Self Care | Payer: 59 | Attending: Emergency Medicine | Admitting: Emergency Medicine

## 2014-11-09 ENCOUNTER — Encounter (HOSPITAL_BASED_OUTPATIENT_CLINIC_OR_DEPARTMENT_OTHER): Payer: Self-pay

## 2014-11-09 DIAGNOSIS — G8929 Other chronic pain: Secondary | ICD-10-CM | POA: Insufficient documentation

## 2014-11-09 DIAGNOSIS — G478 Other sleep disorders: Secondary | ICD-10-CM | POA: Diagnosis not present

## 2014-11-09 DIAGNOSIS — Z87891 Personal history of nicotine dependence: Secondary | ICD-10-CM | POA: Insufficient documentation

## 2014-11-09 DIAGNOSIS — E039 Hypothyroidism, unspecified: Secondary | ICD-10-CM | POA: Diagnosis not present

## 2014-11-09 DIAGNOSIS — Z79899 Other long term (current) drug therapy: Secondary | ICD-10-CM | POA: Insufficient documentation

## 2014-11-09 DIAGNOSIS — T380X5A Adverse effect of glucocorticoids and synthetic analogues, initial encounter: Secondary | ICD-10-CM | POA: Diagnosis not present

## 2014-11-09 DIAGNOSIS — F419 Anxiety disorder, unspecified: Secondary | ICD-10-CM | POA: Insufficient documentation

## 2014-11-09 MED ORDER — DIAZEPAM 5 MG PO TABS
5.0000 mg | ORAL_TABLET | Freq: Once | ORAL | Status: AC
  Administered 2014-11-09: 5 mg via ORAL
  Filled 2014-11-09: qty 1

## 2014-11-09 NOTE — ED Notes (Addendum)
Pt states she has being feeling jittery,numbness all over and fast heart rate since last Thursday after starting new meds for viral infection and strep throat-meds were clarithromycin and dexamethasone-pt stopped meds on Saturday-pt NAD

## 2014-11-09 NOTE — ED Provider Notes (Signed)
CSN: [id number]     Arrival date & time 11/09/14  1221 History   First MD Initiated Contact with Patient 11/09/14 1326     Chief Complaint  Patient presents with  . Allergic Reaction     (Consider location/radiation/quality/duration/timing/severity/associated sxs/prior Treatment) The history is provided by the patient.     Patient is a 39 year old female with history of anxiety, hypothyroidism, otherwise healthy, who presented to the emergency department for feeling jittery with a fast heart rate and inability to sleep for the past 3 days after beginning clarithromycin and dexamethasone on Thursday (4 days ago).  She was having upper respiratory infection symptoms with sore throat, nasal congestion and cough, after she began taking medicines as prescribed by her PCP, the next day she had complete resolution of her symptoms, but began to feel side effects such as sleeplessness increased anxiety, agitation, rapid heart rate, numbness and jittery feeling all over.   Saturday morning she stopped taking the medications, however her symptoms have been persistent over the last 2 days. She presented to the ER for further evaluation.  She denies any chest pain, palpitations, shortness of breath, syncope, rash, nausea, vomiting.   She states she has had a Z-Pak before and also prednisone before without any side effects such as this, however she has never had these 2 medications before.      Past Medical History  Diagnosis Date  . Thyroid disease     hypothyroid  . Anxiety   . Hypothyroidism   . Chronic back pain    Past Surgical History  Procedure Laterality Date  . Tubual reversal    . Tubal ligation    . Leg surgery     Family History  Problem Relation Age of Onset  . Hypertension Father    Social History  Substance Use Topics  . Smoking status: Former Smoker    Quit date: 09/15/2000  . Smokeless tobacco: Never Used  . Alcohol Use: No     Comment: occassional before pregnancy   OB  History    Gravida Para Term Preterm AB TAB SAB Ectopic Multiple Living   5 5 4 1      5      Review of Systems  Constitutional: Negative.   HENT: Negative.   Eyes: Negative.   Respiratory: Negative.   Cardiovascular: Negative for chest pain, palpitations and leg swelling.       Rapid heart rate  Gastrointestinal: Negative.   Genitourinary: Negative.   Musculoskeletal: Negative.   Skin: Negative.  Negative for rash.  Psychiatric/Behavioral: Positive for sleep disturbance, decreased concentration and agitation. Negative for suicidal ideas, hallucinations and dysphoric mood. The patient is nervous/anxious and is hyperactive.       Allergies  Review of patient's allergies indicates no known allergies.  Home Medications   Prior to Admission medications   Medication Sig Start Date End Date Taking? Authorizing Provider  levothyroxine (SYNTHROID, LEVOTHROID) 75 MCG tablet Take 75 mcg by mouth daily before breakfast.    Historical Provider, MD   BP 146/79 mmHg  Pulse 65  Temp(Src) 98.3 F (36.8 C) (Oral)  Resp 18  Ht 5\' 5"  (1.651 m)  Wt 180 lb (81.647 kg)  BMI 29.95 kg/m2  SpO2 100%  LMP 10/24/2014 Physical Exam  Constitutional: She is oriented to person, place, and time. She appears well-developed and well-nourished. No distress.  HENT:  Head: Normocephalic and atraumatic.  Nose: Nose normal.  Mouth/Throat: Oropharynx is clear and moist. No oropharyngeal exudate.  Eyes:  Conjunctivae and EOM are normal. Pupils are equal, round, and reactive to light. Right eye exhibits no discharge. Left eye exhibits no discharge. No scleral icterus.  Neck: Normal range of motion. No JVD present. No tracheal deviation present. No thyromegaly present.  Cardiovascular: Normal rate, regular rhythm, normal heart sounds and intact distal pulses.  Exam reveals no gallop and no friction rub.   No murmur heard. Symmetrical radial and posterior tibial pulses 2+  Pulmonary/Chest: Effort normal and  breath sounds normal. No respiratory distress. She has no wheezes. She has no rales. She exhibits no tenderness.  Abdominal: Soft. Bowel sounds are normal. She exhibits no distension and no mass. There is no tenderness. There is no rebound and no guarding.  Musculoskeletal: Normal range of motion. She exhibits no edema or tenderness.  Lymphadenopathy:    She has no cervical adenopathy.  Neurological: She is alert and oriented to person, place, and time. She has normal reflexes. No cranial nerve deficit. She exhibits normal muscle tone. Coordination normal.  Skin: Skin is warm and dry. No rash noted. She is not diaphoretic. No erythema. No pallor.  Psychiatric: She has a normal mood and affect. Her behavior is normal. Judgment and thought content normal.  Nursing note and vitals reviewed.   ED Course  Procedures (including critical care time) Labs Review Labs Reviewed - No data to display  Imaging Review No results found. I have personally reviewed and evaluated these images and lab results as part of my medical decision-making.   EKG Interpretation   Date/Time:  Monday November 09 2014 12:38:06 EDT Ventricular Rate:  64 PR Interval:  146 QRS Duration: 88 QT Interval:  420 QTC Calculation: 433 R Axis:   54 Text Interpretation:  Normal sinus rhythm with sinus arrhythmia Normal ECG  No previous tracing Confirmed by Anitra Lauth  MD, Alphonzo Lemmings ([id number]) on  11/09/2014 12:51:22 PM      MDM   Final diagnoses:  Corticosteroids adverse reaction, initial encounter    Side effect of methylprednisone and Clarithromycin, suspect it is an adverse effect to the steroid and less likely the antibiotic She discontinued taking her medicines 2 days ago, did not have any more URI or sore throat symptoms.  She has felt jittery, unable to sleep, and otherwise is any other complaints. PO Valium given in ER to see if it helps with her symptoms, she felt an improvement of her jitteriness.   She has been  reassured that the side effects will wear off. We discussed how this should now be listed as an allergy.  She verbalizes understanding.  She will contact her PCPs office for further follow-up.    Danelle Berry, PA-C 11/09/14 1800  Gwyneth Sprout, MD 11/10/14 [phone]

## 2014-11-09 NOTE — Discharge Instructions (Signed)
Drug Allergy °A drug allergy means you have a strange reaction to a medicine. You may have puffiness (swelling), itching, red rashes, and hives. Some allergic reactions can be life-threatening. °HOME CARE  °If you do not know what caused your reaction: °· Write down medicines you use. °· Write down any problems you have after using medicine. °· Avoid things that cause a reaction. °· You can see an allergy doctor to be tested for allergies. °If you have hives or a rash: °· Take medicine as told by your doctor. °· Place cold cloths on your skin. °· Do not take hot baths or hot showers. Take baths in cool water. °If you are severely allergic: °· Wear a medical bracelet or necklace that lists your allergy. °· Carry your allergy kit or medicine shot to treat severe allergic reactions with you. These can save your life. °· Do not drive until medicine from your shot has worn off, unless your doctor says it is okay. °GET HELP RIGHT AWAY IF:  °· Your mouth is puffy, or you have trouble breathing. °· You have a tight feeling in your chest or throat. °· You have hives, puffiness, or itching all over your body. °· You throw up (vomit) or have watery poop (diarrhea). °· You feel dizzy or pass out (faint). °· You think you are having a reaction. Problems often start within 30 minutes after taking a medicine. °· You are getting worse, not better. °· You have new problems. °· Your problems go away and then come back. °This is an emergency. Use your medicine shot or allergy kit as told. Call your local emergency services (911 in [place] after the shot. Even if you feel better after the shot, you need to go to the hospital. You may need more medicine to control a severe reaction. °MAKE SURE YOU: °· Understand these instructions. °· Will watch your condition. °· Will get help right away if you are not doing well or get worse. °  °This information is not intended to replace advice given to you by your health care provider. Make sure you  discuss any questions you have with your health care provider. °  °Document Released: 02/17/2004 Document Revised: 04/03/2011 Document Reviewed: 08/11/2014 °Elsevier Interactive Patient Education ©2016 [employer] Inc. ° °

## 2014-11-09 NOTE — ED Notes (Signed)
Pa  at bedside. 

## 2017-10-16 LAB — HM PAP SMEAR

## 2018-03-11 ENCOUNTER — Encounter: Payer: Self-pay | Admitting: Emergency Medicine

## 2018-03-11 ENCOUNTER — Ambulatory Visit
Admission: EM | Admit: 2018-03-11 | Discharge: 2018-03-11 | Disposition: A | Discharge status: Home / Self Care | Payer: 59 | Attending: Family Medicine | Admitting: Family Medicine

## 2018-03-11 DIAGNOSIS — R0981 Nasal congestion: Secondary | ICD-10-CM

## 2018-03-11 DIAGNOSIS — M791 Myalgia, unspecified site: Secondary | ICD-10-CM

## 2018-03-11 DIAGNOSIS — R5383 Other fatigue: Secondary | ICD-10-CM | POA: Diagnosis not present

## 2018-03-11 DIAGNOSIS — J111 Influenza due to unidentified influenza virus with other respiratory manifestations: Secondary | ICD-10-CM

## 2018-03-11 DIAGNOSIS — J3489 Other specified disorders of nose and nasal sinuses: Secondary | ICD-10-CM | POA: Diagnosis not present

## 2018-03-11 DIAGNOSIS — R6883 Chills (without fever): Secondary | ICD-10-CM | POA: Diagnosis not present

## 2018-03-11 DIAGNOSIS — R69 Illness, unspecified: Principal | ICD-10-CM

## 2018-03-11 DIAGNOSIS — R05 Cough: Secondary | ICD-10-CM

## 2018-03-11 LAB — POCT INFLUENZA A/B
Influenza A, POC: NEGATIVE
Influenza B, POC: NEGATIVE

## 2018-03-11 MED ORDER — CETIRIZINE HCL 10 MG PO CAPS: 10.0000 mg | ORAL_CAPSULE | Freq: Every day | ORAL | 0 refills | Status: DC

## 2018-03-11 MED ORDER — IBUPROFEN 600 MG PO TABS: 600.0000 mg | ORAL_TABLET | Freq: Four times a day (QID) | ORAL | 0 refills | Status: DC | PRN

## 2018-03-11 MED ORDER — BENZONATATE 200 MG PO CAPS
200.0000 mg | ORAL_CAPSULE | Freq: Three times a day (TID) | ORAL | 0 refills | Status: AC | PRN
Start: 2018-03-11 — End: 2018-03-18

## 2018-03-11 MED ORDER — OSELTAMIVIR PHOSPHATE 75 MG PO CAPS: 75.0000 mg | ORAL_CAPSULE | Freq: Two times a day (BID) | ORAL | 0 refills | Status: AC

## 2018-03-11 NOTE — ED Notes (Signed)
Patient able to ambulate independently  

## 2018-03-11 NOTE — Discharge Instructions (Signed)
Flu test negative, but symptoms still seem suggestive of flu. May begin Tamiflu twice daily for 5 days. We recommended symptom control. I expect your symptoms to start improving in the next 1-2 weeks.   1. Take a daily allergy pill/anti-histamine like Zyrtec, Claritin, or Store brand consistently for 2 weeks  2. For congestion you may try an oral decongestant like Mucinex or sudafed. You may also try intranasal flonase nasal spray or saline irrigations (neti pot, sinus cleanse)  3. For your sore throat you may try cepacol lozenges, salt water gargles, throat spray. Treatment of congestion may also help your sore throat.  4. For cough you may try Tessalon, Delsym or Robitussen, Mucinex DM  5. Take Tylenol or Ibuprofen to help with pain/inflammation  6. Stay hydrated, drink plenty of fluids to keep throat coated and less irritated  Honey Tea For cough/sore throat try using a honey-based tea. Use 3 teaspoons of honey with juice squeezed from half lemon. Place shaved pieces of ginger into 1/2-1 cup of water and warm over stove top. Then mix the ingredients and repeat every 4 hours as needed.

## 2018-03-11 NOTE — ED Triage Notes (Signed)
Pt presents to [facility] for assessment of sore throat, aches, pains, chills, cough since this morning.

## 2018-03-12 NOTE — ED Provider Notes (Signed)
[facility]    CSN: [id number] Arrival date & time: 03/11/18  1632     History   Chief Complaint Chief Complaint  Patient presents with  . Flu-Like Symptoms    HPI Alexis Alvarado is a 43 y.o. female no significant past medical history presenting today for evaluation of URI symptoms.  Patient states that this morning she woke up with a slight sore throat, throughout the day she has had worsening symptoms of sore throat, body aches, chills as well as cough.  She is unsure of any known fevers.  She took a small amount of TheraFlu prior to arrival.  Is unsure of any known exposures to flu.    HPI  Past Medical History:  Diagnosis Date  . Anxiety   . Chronic back pain   . Hypothyroidism   . Thyroid disease    hypothyroid    Patient Active Problem List   Diagnosis Date Noted  . Spontaneous vaginal delivery 11/22/2013  . Labor and delivery, indication for care 11/21/2013  . Unspecified hypothyroidism 04/11/2013    Past Surgical History:  Procedure Laterality Date  . LEG SURGERY    . TUBAL LIGATION    . tubual reversal      OB History    Gravida  5   Para  5   Term  4   Preterm  1   AB      Living  5     SAB      TAB      Ectopic      Multiple      Live Births  5            Home Medications    Prior to Admission medications   Medication Sig Start Date End Date Taking? Authorizing Provider  benzonatate (TESSALON) 200 MG capsule Take 1 capsule (200 mg total) by mouth 3 (three) times daily as needed for up to 7 days for cough. 03/11/18 03/18/18  Wieters, Hallie C, PA-C  Cetirizine HCl 10 MG CAPS Take 1 capsule (10 mg total) by mouth daily for 10 days. 03/11/18 03/21/18  Wieters, Hallie C, PA-C  ibuprofen (ADVIL,MOTRIN) 600 MG tablet Take 1 tablet (600 mg total) by mouth every 6 (six) hours as needed. 03/11/18   Wieters, Hallie C, PA-C  levothyroxine (SYNTHROID, LEVOTHROID) 75 MCG tablet Take 75 mcg by mouth daily before breakfast.     [provider]  oseltamivir (TAMIFLU) 75 MG capsule Take 1 capsule (75 mg total) by mouth every 12 (twelve) hours for 5 days. 03/11/18 03/16/18  Wieters, Junius Creamer, PA-C    Family History Family History  Problem Relation Age of Onset  . Hypertension Father     Social History Social History   Tobacco Use  . Smoking status: Former Smoker    Last attempt to quit: 09/15/2000    Years since quitting: 17.4  . Smokeless tobacco: Never Used  Substance Use Topics  . Alcohol use: No    Comment: occassional before pregnancy  . Drug use: No     Allergies   Patient has no known allergies.   Review of Systems Review of Systems  Constitutional: Positive for chills and fatigue. Negative for activity change, appetite change and fever.  HENT: Positive for congestion, rhinorrhea and sore throat. Negative for ear pain, sinus pressure and trouble swallowing.   Eyes: Negative for discharge and redness.  Respiratory: Positive for cough. Negative for chest tightness and shortness of breath.   Cardiovascular:  Negative for chest pain.  Gastrointestinal: Negative for abdominal pain, diarrhea, nausea and vomiting.  Musculoskeletal: Positive for myalgias.  Skin: Negative for rash.  Neurological: Negative for dizziness, light-headedness and headaches.     Physical Exam Triage Vital Signs ED Triage Vitals  Enc Vitals Group     BP 03/11/18 1640 (!) 157/85     Pulse Rate 03/11/18 1640 (!) 106     Resp 03/11/18 1640 18     Temp 03/11/18 1640 99.6 F (37.6 C)     Temp Source 03/11/18 1640 Oral     SpO2 03/11/18 1640 96 %     Weight --      Height --      Head Circumference --      Peak Flow --      Pain Score 03/11/18 1641 7     Pain Loc --      Pain Edu? --      Excl. in GC? --    No data found.  Updated Vital Signs BP (!) 157/85 (BP Location: Left Arm)   Pulse (!) 106   Temp 99.6 F (37.6 C) (Oral)   Resp 18   SpO2 96%   Visual Acuity Right Eye Distance:   Left Eye  Distance:   Bilateral Distance:    Right Eye Near:   Left Eye Near:    Bilateral Near:     Physical Exam Vitals signs and nursing note reviewed.  Constitutional:      General: She is not in acute distress.    Appearance: She is well-developed.  HENT:     Head: Normocephalic and atraumatic.     Ears:     Comments: Bilateral ears without tenderness to palpation of external auricle, tragus and mastoid, EAC's without erythema or swelling, TM's with good bony landmarks and cone of light. Non erythematous.    Mouth/Throat:     Comments: Oral mucosa pink and moist, no tonsillar enlargement or exudate. Posterior pharynx patent and nonerythematous, no uvula deviation or swelling. Normal phonation. Eyes:     Conjunctiva/sclera: Conjunctivae normal.  Neck:     Musculoskeletal: Neck supple.  Cardiovascular:     Rate and Rhythm: Normal rate and regular rhythm.     Heart sounds: No murmur.  Pulmonary:     Effort: Pulmonary effort is normal. No respiratory distress.     Breath sounds: Normal breath sounds.     Comments: Breathing comfortably at rest, CTABL, no wheezing, rales or other adventitious sounds auscultated Abdominal:     Palpations: Abdomen is soft.     Tenderness: There is no abdominal tenderness.  Skin:    General: Skin is warm and dry.  Neurological:     Mental Status: She is alert.      UC Treatments / Results  Labs (all labs ordered are listed, but only abnormal results are displayed) Labs Reviewed  POCT INFLUENZA A/B    EKG None  Radiology No results found.  Procedures Procedures (including critical care time)  Medications Ordered in UC Medications - No data to display  Initial Impression / Assessment and Plan / UC Course  I have reviewed the triage vital signs and the nursing notes.  Pertinent labs & imaging results that were available during my care of the patient were reviewed by me and considered in my medical decision making (see chart for  details).     Patient with URI symptoms x1 day, low-grade fever in clinic today, likely early influenza.  Will  recommend symptomatic and supportive care at this time, did prescribe Tamiflu after discussing pros and cons.  Continue to monitor symptoms,Discussed strict return precautions. Patient verbalized understanding and is agreeable with plan.  Final Clinical Impressions(s) / UC Diagnoses   Final diagnoses:  Influenza-like illness     Discharge Instructions     Flu test negative, but symptoms still seem suggestive of flu. May begin Tamiflu twice daily for 5 days. We recommended symptom control. I expect your symptoms to start improving in the next 1-2 weeks.   1. Take a daily allergy pill/anti-histamine like Zyrtec, Claritin, or Store brand consistently for 2 weeks  2. For congestion you may try an oral decongestant like Mucinex or sudafed. You may also try intranasal flonase nasal spray or saline irrigations (neti pot, sinus cleanse)  3. For your sore throat you may try cepacol lozenges, salt water gargles, throat spray. Treatment of congestion may also help your sore throat.  4. For cough you may try Tessalon, Delsym or Robitussen, Mucinex DM  5. Take Tylenol or Ibuprofen to help with pain/inflammation  6. Stay hydrated, drink plenty of fluids to keep throat coated and less irritated  Honey Tea For cough/sore throat try using a honey-based tea. Use 3 teaspoons of honey with juice squeezed from half lemon. Place shaved pieces of ginger into 1/2-1 cup of water and warm over stove top. Then mix the ingredients and repeat every 4 hours as needed.   ED Prescriptions    Medication Sig Dispense Auth. Provider   oseltamivir (TAMIFLU) 75 MG capsule Take 1 capsule (75 mg total) by mouth every 12 (twelve) hours for 5 days. 10 capsule Wieters, Hallie C, PA-C   ibuprofen (ADVIL,MOTRIN) 600 MG tablet Take 1 tablet (600 mg total) by mouth every 6 (six) hours as needed. 30 tablet Wieters,  Hallie C, PA-C   Cetirizine HCl 10 MG CAPS Take 1 capsule (10 mg total) by mouth daily for 10 days. 10 capsule Wieters, Hallie C, PA-C   benzonatate (TESSALON) 200 MG capsule Take 1 capsule (200 mg total) by mouth 3 (three) times daily as needed for up to 7 days for cough. 28 capsule Wieters, Hallie C, PA-C     Controlled Substance Prescriptions [place] Controlled Substance Registry consulted? Not Applicable   Lew DawesWieters, Hallie C, [place] JerseyPA-C 03/12/18 2102

## 2018-11-12 ENCOUNTER — Other Ambulatory Visit: Payer: Self-pay

## 2018-11-12 DIAGNOSIS — Z20822 Contact with and (suspected) exposure to covid-19: Secondary | ICD-10-CM

## 2018-11-13 LAB — NOVEL CORONAVIRUS, NAA: SARS-CoV-2, NAA: NOT DETECTED

## 2019-01-08 ENCOUNTER — Encounter (HOSPITAL_BASED_OUTPATIENT_CLINIC_OR_DEPARTMENT_OTHER): Payer: Self-pay | Admitting: *Deleted

## 2019-01-08 ENCOUNTER — Emergency Department (HOSPITAL_BASED_OUTPATIENT_CLINIC_OR_DEPARTMENT_OTHER)
Admission: EM | Admit: 2019-01-08 | Discharge: 2019-01-08 | Disposition: A | Discharge status: Home / Self Care | Payer: 59 | Attending: Emergency Medicine | Admitting: Emergency Medicine

## 2019-01-08 ENCOUNTER — Emergency Department (HOSPITAL_BASED_OUTPATIENT_CLINIC_OR_DEPARTMENT_OTHER): Payer: 59

## 2019-01-08 ENCOUNTER — Other Ambulatory Visit: Payer: Self-pay

## 2019-01-08 DIAGNOSIS — R29898 Other symptoms and signs involving the musculoskeletal system: Secondary | ICD-10-CM | POA: Insufficient documentation

## 2019-01-08 DIAGNOSIS — Z79899 Other long term (current) drug therapy: Secondary | ICD-10-CM | POA: Insufficient documentation

## 2019-01-08 DIAGNOSIS — Z87891 Personal history of nicotine dependence: Secondary | ICD-10-CM | POA: Insufficient documentation

## 2019-01-08 DIAGNOSIS — E039 Hypothyroidism, unspecified: Secondary | ICD-10-CM | POA: Diagnosis not present

## 2019-01-08 DIAGNOSIS — R0789 Other chest pain: Secondary | ICD-10-CM | POA: Diagnosis not present

## 2019-01-08 LAB — CBC WITH DIFFERENTIAL/PLATELET
Abs Immature Granulocytes: 0.02 10*3/uL (ref 0.00–0.07)
Basophils Absolute: 0 10*3/uL (ref 0.0–0.1)
Basophils Relative: 0 %
Eosinophils Absolute: 0.1 10*3/uL (ref 0.0–0.5)
Eosinophils Relative: 2 %
HCT: 39.8 % (ref 36.0–46.0)
Hemoglobin: 13.4 g/dL (ref 12.0–15.0)
Immature Granulocytes: 0 %
Lymphocytes Relative: 35 %
Lymphs Abs: 2.5 10*3/uL (ref 0.7–4.0)
MCH: 28.8 pg (ref 26.0–34.0)
MCHC: 33.7 g/dL (ref 30.0–36.0)
MCV: 85.6 fL (ref 80.0–100.0)
Monocytes Absolute: 0.5 10*3/uL (ref 0.1–1.0)
Monocytes Relative: 7 %
Neutro Abs: 3.9 10*3/uL (ref 1.7–7.7)
Neutrophils Relative %: 56 %
Platelets: 187 10*3/uL (ref 150–400)
RBC: 4.65 MIL/uL (ref 3.87–5.11)
RDW: 12.6 % (ref 11.5–15.5)
WBC: 7.1 10*3/uL (ref 4.0–10.5)
nRBC: 0 % (ref 0.0–0.2)

## 2019-01-08 LAB — COMPREHENSIVE METABOLIC PANEL
ALT: 19 U/L (ref 0–44)
AST: 15 U/L (ref 15–41)
Albumin: 3.8 g/dL (ref 3.5–5.0)
Alkaline Phosphatase: 43 U/L (ref 38–126)
Anion gap: 5 (ref 5–15)
BUN: 11 mg/dL (ref 6–20)
CO2: 26 mmol/L (ref 22–32)
Calcium: 9 mg/dL (ref 8.9–10.3)
Chloride: 108 mmol/L (ref 98–111)
Creatinine, Ser: 0.85 mg/dL (ref 0.44–1.00)
GFR calc Af Amer: >60 mL/min (ref >60)
GFR calc non Af Amer: >60 mL/min (ref >60)
Glucose, Bld: 102 mg/dL — ABNORMAL HIGH (ref 70–99)
Potassium: 3.9 mmol/L (ref 3.5–5.1)
Sodium: 139 mmol/L (ref 135–145)
Total Bilirubin: 0.5 mg/dL (ref 0.3–1.2)
Total Protein: 6.6 g/dL (ref 6.5–8.1)

## 2019-01-08 LAB — TROPONIN I (HIGH SENSITIVITY): Troponin I (High Sensitivity): <2 ng/L (ref <18)

## 2019-01-08 MED ORDER — KETOROLAC TROMETHAMINE 30 MG/ML IJ SOLN
30.0000 mg | Freq: Once | INTRAMUSCULAR | Status: AC
  Administered 2019-01-08: 30 mg via INTRAVENOUS
  Filled 2019-01-08: qty 1

## 2019-01-08 MED ORDER — SODIUM CHLORIDE 0.9 % IV BOLUS
1000.0000 mL | Freq: Once | INTRAVENOUS | Status: AC
  Administered 2019-01-08: 1000 mL via INTRAVENOUS

## 2019-01-08 MED ORDER — PREDNISONE 10 MG PO TABS: 20.0000 mg | ORAL_TABLET | Freq: Two times a day (BID) | ORAL | 0 refills | Status: DC

## 2019-01-08 NOTE — ED Provider Notes (Signed)
[facility] EMERGENCY DEPARTMENT Provider Note   CSN: [id number] Arrival date & time: 01/08/19  1907     History Chief Complaint  Patient presents with  . Chest Pain    Alexis Alvarado is a 43 y.o. female.  Patient is a 43 year old female with history of anxiety, chronic back pain, hypothyroidism.  She presents today for evaluation of chest pain and left arm weakness.  This began approximately 2 hours prior to presentation.  Patient states that she was working at home on the computer when symptoms began.  She describes a pressure to the left upper chest with occasional sharp pains.  She feels as though her left arm is numb.  She denies any shortness of breath, nausea, or diaphoresis.  She denies any cardiac history or any recent exertional symptoms.  The history is provided by the patient.  Chest Pain Pain location:  L chest Pain quality: pressure and sharp   Pain radiates to:  L arm Pain severity:  Moderate Onset quality:  Sudden Duration:  2 hours Timing:  Constant Progression:  Unchanged Chronicity:  New Relieved by:  Nothing Worsened by:  Nothing Ineffective treatments:  None tried      Past Medical History:  Diagnosis Date  . Anxiety   . Chronic back pain   . Hypothyroidism   . Thyroid disease    hypothyroid    Patient Active Problem List   Diagnosis Date Noted  . Spontaneous vaginal delivery 11/22/2013  . Labor and delivery, indication for care 11/21/2013  . Unspecified hypothyroidism 04/11/2013    Past Surgical History:  Procedure Laterality Date  . LEG SURGERY    . TUBAL LIGATION    . tubual reversal       OB History    Gravida  5   Para  5   Term  4   Preterm  1   AB      Living  5     SAB      TAB      Ectopic      Multiple      Live Births  5           Family History  Problem Relation Age of Onset  . Hypertension Father     Social History   Tobacco Use  . Smoking status: Former Smoker    Quit date:  09/15/2000    Years since quitting: 18.3  . Smokeless tobacco: Never Used  Substance Use Topics  . Alcohol use: No    Comment: occassional before pregnancy  . Drug use: No    Home Medications Prior to Admission medications   Medication Sig Start Date End Date Taking? Authorizing Provider  Cetirizine HCl 10 MG CAPS Take 1 capsule (10 mg total) by mouth daily for 10 days. 03/11/18 03/21/18  Wieters, Hallie C, PA-C  ibuprofen (ADVIL,MOTRIN) 600 MG tablet Take 1 tablet (600 mg total) by mouth every 6 (six) hours as needed. 03/11/18   Wieters, Hallie C, PA-C  levothyroxine (SYNTHROID, LEVOTHROID) 75 MCG tablet Take 75 mcg by mouth daily before breakfast.    [provider]    Allergies    Patient has no known allergies.  Review of Systems   Review of Systems  Cardiovascular: Positive for chest pain.  All other systems reviewed and are negative.   Physical Exam Updated Vital Signs BP 121/82   Pulse 81   Temp 98.1 F (36.7 C) (Oral)   Resp 16   Ht  5\' 5"  (1.651 m)   Wt 94.8 kg   SpO2 100%   BMI 34.78 kg/m   Physical Exam Vitals and nursing note reviewed.  Constitutional:      General: She is not in acute distress.    Appearance: She is well-developed. She is not diaphoretic.  HENT:     Head: Normocephalic and atraumatic.  Cardiovascular:     Rate and Rhythm: Normal rate and regular rhythm.     Heart sounds: No murmur. No friction rub. No gallop.   Pulmonary:     Effort: Pulmonary effort is normal. No respiratory distress.     Breath sounds: Normal breath sounds. No wheezing.  Abdominal:     General: Bowel sounds are normal. There is no distension.     Palpations: Abdomen is soft.     Tenderness: There is no abdominal tenderness.  Musculoskeletal:        General: Normal range of motion.     Cervical back: Normal range of motion and neck supple.     Right lower leg: No tenderness. No edema.     Left lower leg: No tenderness. No edema.     Comments: The left arm  appears grossly normal.  Ulnar and radial pulses are easily palpable.  There is no swelling or deformity.  Skin:    General: Skin is warm and dry.  Neurological:     Mental Status: She is alert and oriented to person, place, and time.     Comments: Strength is 5 out of 5 throughout the right upper extremity.  Strength is 4 out of 5 in the left upper extremity including elbow extension, flexion, shoulder abduction, hand grip, and wrist flexion and extension.     ED Results / Procedures / Treatments   Labs (all labs ordered are listed, but only abnormal results are displayed) Labs Reviewed  COMPREHENSIVE METABOLIC PANEL  CBC WITH DIFFERENTIAL/PLATELET  TROPONIN I (HIGH SENSITIVITY)    EKG None  Radiology No results found.  Procedures Procedures (including critical care time)  Medications Ordered in ED Medications  sodium chloride 0.9 % bolus 1,000 mL (has no administration in time range)  ketorolac (TORADOL) 30 MG/ML injection 30 mg (has no administration in time range)    ED Course  I have reviewed the triage vital signs and the nursing notes.  Pertinent labs & imaging results that were available during my care of the patient were reviewed by me and considered in my medical decision making (see chart for details).  Patient is a 43 year old female presenting with complaints of chest discomfort that started while working on the computer this evening.  She describes weakness in her left arm as well.  She does have diffuse weakness to the left arm, the etiology of which I am uncertain.  Her cardiac work-up is unremarkable and chest x-ray is clear.  This appears to be some sort of radiculopathy that will be treated with prednisone and prn follow up.  MDM Rules/Calculators/A&P  Final Clinical Impression(s) / ED Diagnoses Final diagnoses:  None    Rx / DC Orders ED Discharge Orders    None       Veryl Speak, MD 01/08/19 2143

## 2019-01-08 NOTE — ED Triage Notes (Signed)
C/o left sided shoulder pain which radiates to left chest with left arm weakness x 1 hr ago

## 2019-01-08 NOTE — Discharge Instructions (Addendum)
Prednisone as prescribed.  Follow-up with your primary doctor if symptoms or not improving in the next week, and return to the ER if you develop worsening pain, difficulty breathing, worsening weakness, or other new and concerning symptoms.

## 2019-04-29 ENCOUNTER — Telehealth: Payer: Self-pay | Admitting: General Practice

## 2019-04-29 NOTE — Telephone Encounter (Signed)
LVM regarding online request the patient sent in to schedule as a new patient.

## 2019-05-15 ENCOUNTER — Ambulatory Visit: Payer: 59 | Admitting: Family Medicine

## 2019-05-26 ENCOUNTER — Encounter: Payer: Self-pay | Admitting: Family Medicine

## 2019-05-26 ENCOUNTER — Ambulatory Visit (INDEPENDENT_AMBULATORY_CARE_PROVIDER_SITE_OTHER): Payer: 59 | Admitting: Family Medicine

## 2019-05-26 ENCOUNTER — Other Ambulatory Visit: Payer: Self-pay

## 2019-05-26 VITALS — BP 134/78 | HR 80 | Temp 97.8°F | Ht 65.0 in | Wt 203.8 lb

## 2019-05-26 DIAGNOSIS — M722 Plantar fascial fibromatosis: Secondary | ICD-10-CM | POA: Diagnosis not present

## 2019-05-26 DIAGNOSIS — Z0000 Encounter for general adult medical examination without abnormal findings: Secondary | ICD-10-CM

## 2019-05-26 DIAGNOSIS — R5383 Other fatigue: Secondary | ICD-10-CM

## 2019-05-26 MED ORDER — DICLOFENAC SODIUM 1 % EX GEL: CUTANEOUS | 1 refills | Status: DC

## 2019-05-26 NOTE — Progress Notes (Signed)
New Patient Office Visit  Subjective:  Patient ID: Alexis Alvarado, female    DOB: [birth year]  Age: 44 y.o. MRN: [id number]  CC:  Chief Complaint  Patient presents with  . Establish Care    New pt, questions about thyroid, c/o pain in right heel of foot.     HPI Alexis Alvarado presents for establishment of care and a discussion of her hypothyroidism and right heel pain.  Longstanding history of hypothyroidism treated with Synthroid, trade.  Dosage was recently increased by her endocrinologist.  She is waiting to receive the the newer dose of 150 mcg.  She is somewhat disappointed that her energy levels and ability to lose weight have not been improved since her treatment of hypothyroidism.  She does have a history of gestational diabetes with her oldest child.  She is found it difficult to exercise with a 6-month history of right heel pain.  Pain is worse after she has been resting or when she gets up in the morning.  Past Medical History:  Diagnosis Date  . Anxiety   . Chronic back pain   . Hypothyroidism   . Thyroid disease    hypothyroid    Past Surgical History:  Procedure Laterality Date  . LEG SURGERY    . TUBAL LIGATION    . tubual reversal      Family History  Problem Relation Age of Onset  . Hypertension Father   . COPD Mother   . Thyroid disease Maternal Grandmother     Social History   Socioeconomic History  . Marital status: Single    Spouse name: Not on file  . Number of children: Not on file  . Years of education: Not on file  . Highest education level: Not on file  Occupational History  . Not on file  Tobacco Use  . Smoking status: Former Smoker    Quit date: 09/15/2000    Years since quitting: 18.7  . Smokeless tobacco: Never Used  Substance and Sexual Activity  . Alcohol use: No    Comment: occassional before pregnancy  . Drug use: No  . Sexual activity: Yes    Birth control/protection: None, I.U.D.  Other Topics Concern  . Not on file    Social History Narrative  . Not on file   Social Determinants of Health   Financial Resource Strain:   . Difficulty of Paying Living Expenses:   Food Insecurity:   . Worried About Programme researcher, broadcasting/film/video in the Last Year:   . Barista in the Last Year:   Transportation Needs:   . Freight forwarder (Medical):   Marland Kitchen Lack of Transportation (Non-Medical):   Physical Activity:   . Days of Exercise per Week:   . Minutes of Exercise per Session:   Stress:   . Feeling of Stress :   Social Connections:   . Frequency of Communication with Friends and Family:   . Frequency of Social Gatherings with Friends and Family:   . Attends Religious Services:   . Active Member of Clubs or Organizations:   . Attends Banker Meetings:   Marland Kitchen Marital Status:   Intimate Partner Violence:   . Fear of Current or Ex-Partner:   . Emotionally Abused:   Marland Kitchen Physically Abused:   . Sexually Abused:     ROS Review of Systems  Constitutional: Positive for fatigue. Negative for diaphoresis, fever and unexpected weight change.  HENT: Negative.   Respiratory: Negative.  Cardiovascular: Negative.   Gastrointestinal: Negative.   Musculoskeletal: Positive for arthralgias and gait problem.  Skin: Negative for pallor and rash.  Neurological: Negative for weakness and numbness.  Psychiatric/Behavioral: The patient is nervous/anxious.    Depression screen [facility] 2/9 05/26/2019 05/26/2019  Decreased Interest 0 0  Down, Depressed, Hopeless 0 0  PHQ - 2 Score 0 0  Altered sleeping 0 -  Tired, decreased energy 1 -  Change in appetite 0 -  Feeling bad or failure about yourself  0 -  Trouble concentrating 0 -  Moving slowly or fidgety/restless 0 -  Suicidal thoughts 0 -  PHQ-9 Score 1 -  Difficult doing work/chores Not difficult at all -    Objective:   Today's Vitals: BP 134/78   Pulse 80   Temp 97.8 F (36.6 C) (Tympanic)   Ht 5\' 5"  (1.651 m)   Wt 203 lb 12.8 oz (92.4 kg)   SpO2 96%    BMI 33.91 kg/m   Physical Exam Constitutional:      General: She is not in acute distress.    Appearance: Normal appearance. She is not ill-appearing, toxic-appearing or diaphoretic.  HENT:     Head: Normocephalic and atraumatic.     Right Ear: External ear normal.     Left Ear: External ear normal.  Eyes:     General: No scleral icterus.       Right eye: No discharge.        Left eye: No discharge.     Conjunctiva/sclera: Conjunctivae normal.  Cardiovascular:     Pulses:          Dorsalis pedis pulses are 2+ on the right side and 2+ on the left side.       Posterior tibial pulses are 2+ on the right side and 2+ on the left side.  Pulmonary:     Effort: Pulmonary effort is normal.  Skin:    General: Skin is warm and dry.  Neurological:     Mental Status: She is oriented to person, place, and time.  Psychiatric:        Attention and Perception: Attention normal.        Mood and Affect: Mood normal.        Speech: Speech normal.        Behavior: Behavior normal.    Diabetic Foot Exam - Simple   Simple Foot Form Visual Inspection See comments: Yes Sensation Testing See comments: Yes Pulse Check Posterior Tibialis and Dorsalis pulse intact bilaterally: Yes Comments Feet are both cavus.  ttp of sole of foot over calcaneus   Neg Thompson test bilaterally.      Assessment & Plan:   Problem List Items Addressed This Visit      Musculoskeletal and Integument   Plantar fasciitis of right foot - Primary   Relevant Medications   diclofenac Sodium (VOLTAREN) 1 % GEL   Other Relevant Orders   DG Foot Complete Right    Other Visit Diagnoses    Other fatigue       Relevant Orders   Hemoglobin A1c   VITAMIN D 25 Hydroxy (Vit-D Deficiency, Fractures)   Healthcare maintenance       Relevant Orders   CBC   Comprehensive metabolic panel   LDL cholesterol, direct   Hemoglobin A1c   Lipid panel   Urinalysis, Routine w reflex microscopic      Outpatient Encounter  Medications as of 05/26/2019  Medication Sig  . levonorgestrel (MIRENA, 52  MG,) 20 MCG/24HR IUD Mirena 20 mcg/24 hours (6 yrs) 52 mg intrauterine device  Take 1 device by intrauterine route.  Marland Kitchen levothyroxine (SYNTHROID) 150 MCG tablet Take 1 tablet in AM on empty stomach once a day for thyroid (name brand only DAW)  . Cetirizine HCl 10 MG CAPS Take 1 capsule (10 mg total) by mouth daily for 10 days.  . diclofenac Sodium (VOLTAREN) 1 % GEL Apply a grape sized dollop 3 times daily to right heel.  Marland Kitchen ibuprofen (ADVIL,MOTRIN) 600 MG tablet Take 1 tablet (600 mg total) by mouth every 6 (six) hours as needed.  Marland Kitchen levothyroxine (SYNTHROID, LEVOTHROID) 75 MCG tablet Take 75 mcg by mouth daily before breakfast.  . predniSONE (DELTASONE) 10 MG tablet Take 2 tablets (20 mg total) by mouth 2 (two) times daily.   No facility-administered encounter medications on file as of 05/26/2019.    Follow-up: Return in about 1 month (around 06/26/2019), or if symptoms worsen or fail to improve, for return fasting for blood work. . Suggested stretching after sitting and before getting out of the bed each morning.  Apply Voltaren gel 3 times daily.  Change to footwear with good arch support and heel and toe cushioning.  Will consider sports medicine referral for orthotics.  Mliss Sax, MD

## 2019-05-26 NOTE — Patient Instructions (Signed)
Plantar Fasciitis  Plantar fasciitis is a painful foot condition that affects the heel. It occurs when the band of tissue that connects the toes to the heel bone (plantar fascia) becomes irritated. This can happen as the result of exercising too much or doing other repetitive activities (overuse injury). The pain from plantar fasciitis can range from mild irritation to severe pain that makes it difficult to walk or move. The pain is usually worse in the morning after sleeping, or after sitting or lying down for a while. Pain may also be worse after long periods of walking or standing. What are the causes? This condition may be caused by:  Standing for long periods of time.  Wearing shoes that do not have good arch support.  Doing activities that put stress on joints (high-impact activities), including running, aerobics, and ballet.  Being overweight.  An abnormal way of walking (gait).  Tight muscles in the back of your lower leg (calf).  High arches in your feet.  Starting a new athletic activity. What are the signs or symptoms? The main symptom of this condition is heel pain. Pain may:  Be worse with first steps after a time of rest, especially in the morning after sleeping or after you have been sitting or lying down for a while.  Be worse after long periods of standing still.  Decrease after 30-45 minutes of activity, such as gentle walking. How is this diagnosed? This condition may be diagnosed based on your medical history and your symptoms. Your health care provider may ask questions about your activity level. Your health care provider will do a physical exam to check for:  A tender area on the bottom of your foot.  A high arch in your foot.  Pain when you move your foot.  Difficulty moving your foot. You may have imaging tests to confirm the diagnosis, such as:  X-rays.  Ultrasound.  MRI. How is this treated? Treatment for plantar fasciitis depends on how  severe your condition is. Treatment may include:  Rest, ice, applying pressure (compression), and raising the affected foot (elevation). This may be called RICE therapy. Your health care provider may recommend RICE therapy along with over-the-counter pain medicines to manage your pain.  Exercises to stretch your calves and your plantar fascia.  A splint that holds your foot in a stretched, upward position while you sleep (night splint).  Physical therapy to relieve symptoms and prevent problems in the future.  Injections of steroid medicine (cortisone) to relieve pain and inflammation.  Stimulating your plantar fascia with electrical impulses (extracorporeal shock wave therapy). This is usually the last treatment option before surgery.  Surgery, if other treatments have not worked after 12 months. Follow these instructions at home:  Managing pain, stiffness, and swelling  If directed, put ice on the painful area: ? Put ice in a plastic bag, or use a frozen bottle of water. ? Place a towel between your skin and the bag or bottle. ? Roll the bottom of your foot over the bag or bottle. ? Do this for 20 minutes, 2-3 times a day.  Wear athletic shoes that have air-sole or gel-sole cushions, or try wearing soft shoe inserts that are designed for plantar fasciitis.  Raise (elevate) your foot above the level of your heart while you are sitting or lying down. Activity  Avoid activities that cause pain. Ask your health care provider what activities are safe for you.  Do physical therapy exercises and stretches as told   by your health care provider.  Try activities and forms of exercise that are easier on your joints (low-impact). Examples include swimming, water aerobics, and biking. General instructions  Take over-the-counter and prescription medicines only as told by your health care provider.  Wear a night splint while sleeping, if told by your health care provider. Loosen the splint  if your toes tingle, become numb, or turn cold and blue.  Maintain a healthy weight, or work with your health care provider to lose weight as needed.  Keep all follow-up visits as told by your health care provider. This is important. Contact a health care provider if you:  Have symptoms that do not go away after caring for yourself at home.  Have pain that gets worse.  Have pain that affects your ability to move or do your daily activities. Summary  Plantar fasciitis is a painful foot condition that affects the heel. It occurs when the band of tissue that connects the toes to the heel bone (plantar fascia) becomes irritated.  The main symptom of this condition is heel pain that may be worse after exercising too much or standing still for a long time.  Treatment varies, but it usually starts with rest, ice, compression, and elevation (RICE therapy) and over-the-counter medicines to manage pain. This information is not intended to replace advice given to you by your health care provider. Make sure you discuss any questions you have with your health care provider. Document Revised: 12/22/2016 Document Reviewed: 11/06/2016 Elsevier Patient Education  2020 [employer]  Plantar Fasciitis Rehab Ask your health care provider which exercises are safe for you. Do exercises exactly as told by your health care provider and adjust them as directed. It is normal to feel mild stretching, pulling, tightness, or discomfort as you do these exercises. Stop right away if you feel sudden pain or your pain gets worse. Do not begin these exercises until told by your health care provider. Stretching and range-of-motion exercises These exercises warm up your muscles and joints and improve the movement and flexibility of your foot. These exercises also help to relieve pain. Plantar fascia stretch  1. Sit with your left / right leg crossed over your opposite knee. 2. Hold your heel with one hand with that  thumb near your arch. With your other hand, hold your toes and gently pull them back toward the top of your foot. You should feel a stretch on the bottom of your toes or your foot (plantar fascia) or both. 3. Hold this stretch for__________ seconds. 4. Slowly release your toes and return to the starting position. Repeat __________ times. Complete this exercise __________ times a day. Gastrocnemius stretch, standing This exercise is also called a calf (gastroc) stretch. It stretches the muscles in the back of the upper calf. 1. Stand with your hands against a wall. 2. Extend your left / right leg behind you, and bend your front knee slightly. 3. Keeping your heels on the floor and your back knee straight, shift your weight toward the wall. Do not arch your back. You should feel a gentle stretch in your upper left / right calf. 4. Hold this position for __________ seconds. Repeat __________ times. Complete this exercise __________ times a day. Soleus stretch, standing This exercise is also called a calf (soleus) stretch. It stretches the muscles in the back of the lower calf. 1. Stand with your hands against a wall. 2. Extend your left / right leg behind you, and bend your front   knee slightly. 3. Keeping your heels on the floor, bend your back knee and shift your weight slightly over your back leg. You should feel a gentle stretch deep in your lower calf. 4. Hold this position for __________ seconds. Repeat __________ times. Complete this exercise __________ times a day. Gastroc and soleus stretch, standing step This exercise stretches the muscles in the back of the lower leg. These muscles are in the upper calf (gastrocnemius) and the lower calf (soleus). 1. Stand with the ball of your left / right foot on a step. The ball of your foot is on the walking surface, right under your toes. 2. Keep your other foot firmly on the same step. 3. Hold on to the wall or a railing for balance. 4. Slowly  lift your other foot, allowing your body weight to press your left / right heel down over the edge of the step. You should feel a stretch in your left / right calf. 5. Hold this position for __________ seconds. 6. Return both feet to the step. 7. Repeat this exercise with a slight bend in your left / right knee. Repeat __________ times with your left / right knee straight and __________ times with your left / right knee bent. Complete this exercise __________ times a day. Balance exercise This exercise builds your balance and strength control of your arch to help take pressure off your plantar fascia. Single leg stand If this exercise is too easy, you can try it with your eyes closed or while standing on a pillow. 1. Without shoes, stand near a railing or in a doorway. You may hold on to the railing or door frame as needed. 2. Stand on your left / right foot. Keep your big toe down on the floor and try to keep your arch lifted. Do not let your foot roll inward. 3. Hold this position for __________ seconds. Repeat __________ times. Complete this exercise __________ times a day. This information is not intended to replace advice given to you by your health care provider. Make sure you discuss any questions you have with your health care provider. Document Revised: 05/02/2018 Document Reviewed: 11/07/2017 Elsevier Patient Education  2020 [employer]  Mindfulness-Based Stress Reduction Mindfulness-based stress reduction (MBSR) is a program that helps people learn to practice mindfulness. Mindfulness is the practice of intentionally paying attention to the present moment. It can be learned and practiced through techniques such as education, breathing exercises, meditation, and yoga. MBSR includes several mindfulness techniques in one program. MBSR works best when you understand the treatment, are willing to try new things, and can commit to spending time practicing what you learn. MBSR training may  include learning about:  How your emotions, thoughts, and reactions affect your body.  New ways to respond to things that cause negative thoughts to start (triggers).  How to notice your thoughts and let go of them.  Practicing awareness of everyday things that you normally do without thinking.  The techniques and goals of different types of meditation. What are the benefits of MBSR? MBSR can have many benefits, which include helping you to:  Develop self-awareness. This refers to knowing and understanding yourself.  Learn skills and attitudes that help you to participate in your own health care.  Learn new ways to care for yourself.  Be more accepting about how things are, and let things go.  Be less judgmental and approach things with an open mind.  Be patient with yourself and trust yourself more.  MBSR has also been shown to:  Reduce negative emotions, such as depression and anxiety.  Improve memory and focus.  Change how you sense and approach pain.  Boost your body's ability to fight infections.  Help you connect better with other people.  Improve your sense of well-being. Follow these instructions at home:   Find a local in-person or online MBSR program.  Set aside some time regularly for mindfulness practice.  Find a mindfulness practice that works best for you. This may include one or more of the following: ? Meditation. Meditation involves focusing your mind on a certain thought or activity. ? Breathing awareness exercises. These help you to stay present by focusing on your breath. ? Body scan. For this practice, you lie down and pay attention to each part of your body from head to toe. You can identify tension and soreness and intentionally relax parts of your body. ? Yoga. Yoga involves stretching and breathing, and it can improve your ability to move and be flexible. It can also provide an experience of testing your body's limits, which can help you  release stress. ? Mindful eating. This way of eating involves focusing on the taste, texture, color, and smell of each bite of food. Because this slows down eating and helps you feel full sooner, it can be an important part of a weight-loss plan.  Find a podcast or recording that provides guidance for breathing awareness, body scan, or meditation exercises. You can listen to these any time when you have a free moment to rest without distractions.  Follow your treatment plan as told by your health care provider. This may include taking regular medicines and making changes to your diet or lifestyle as recommended. How to practice mindfulness To do a basic awareness exercise:  Find a comfortable place to sit.  Pay attention to the present moment. Observe your thoughts, feelings, and surroundings just as they are.  Avoid placing judgment on yourself, your feelings, or your surroundings. Make note of any judgment that comes up, and let it go.  Your mind may wander, and that is okay. Make note of when your thoughts drift, and return your attention to the present moment. To do basic mindfulness meditation:  Find a comfortable place to sit. This may include a stable chair or a firm floor cushion. ? Sit upright with your back straight. Let your arms fall next to your side with your hands resting on your legs. ? If sitting in a chair, rest your feet flat on the floor. ? If sitting on a cushion, cross your legs in front of you.  Keep your head in a neutral position with your chin dropped slightly. Relax your jaw and rest the tip of your tongue on the roof of your mouth. Drop your gaze to the floor. You can close your eyes if you like.  Breathe normally and pay attention to your breath. Feel the air moving in and out of your nose. Feel your belly expanding and relaxing with each breath.  Your mind may wander, and that is okay. Make note of when your thoughts drift, and return your attention to your  breath.  Avoid placing judgment on yourself, your feelings, or your surroundings. Make note of any judgment or feelings that come up, let them go, and bring your attention back to your breath.  When you are ready, lift your gaze or open your eyes. Pay attention to how your body feels after the meditation. Where to find  more information You can find more information about MBSR from:  Your health care provider.  Community-based meditation centers or programs.  Programs offered near you. Summary  Mindfulness-based stress reduction (MBSR) is a program that teaches you how to intentionally pay attention to the present moment. It is used with other treatments to help you cope better with daily stress, emotions, and pain.  MBSR focuses on developing self-awareness, which allows you to respond to life stress without judgment or negative emotions.  MBSR programs may involve learning different mindfulness practices, such as breathing exercises, meditation, yoga, body scan, or mindful eating. Find a mindfulness practice that works best for you, and set aside time for it on a regular basis. This information is not intended to replace advice given to you by your health care provider. Make sure you discuss any questions you have with your health care provider. Document Revised: 12/22/2016 Document Reviewed: 05/18/2016 Elsevier Patient Education  2020 [employer].

## 2019-05-28 ENCOUNTER — Ambulatory Visit (INDEPENDENT_AMBULATORY_CARE_PROVIDER_SITE_OTHER): Payer: 59

## 2019-05-28 ENCOUNTER — Other Ambulatory Visit (INDEPENDENT_AMBULATORY_CARE_PROVIDER_SITE_OTHER): Payer: 59

## 2019-05-28 ENCOUNTER — Other Ambulatory Visit: Payer: Self-pay

## 2019-05-28 DIAGNOSIS — Z0000 Encounter for general adult medical examination without abnormal findings: Secondary | ICD-10-CM

## 2019-05-28 DIAGNOSIS — R5383 Other fatigue: Secondary | ICD-10-CM

## 2019-05-28 DIAGNOSIS — M722 Plantar fascial fibromatosis: Secondary | ICD-10-CM

## 2019-05-28 LAB — LIPID PANEL
Cholesterol: 164 mg/dL (ref 0–200)
HDL: 71.5 mg/dL (ref >39.00)
LDL Cholesterol: 81 mg/dL (ref 0–99)
NonHDL: 92.38
Total CHOL/HDL Ratio: 2
Triglycerides: 59 mg/dL (ref 0.0–149.0)
VLDL: 11.8 mg/dL (ref 0.0–40.0)

## 2019-05-28 LAB — COMPREHENSIVE METABOLIC PANEL
ALT: 6 U/L (ref 0–35)
AST: 20 U/L (ref 0–37)
Albumin: 4.4 g/dL (ref 3.5–5.2)
Alkaline Phosphatase: 64 U/L (ref 39–117)
BUN: 15 mg/dL (ref 6–23)
CO2: 32 mEq/L (ref 19–32)
Calcium: 9.7 mg/dL (ref 8.4–10.5)
Chloride: 102 mEq/L (ref 96–112)
Creatinine, Ser: 0.78 mg/dL (ref 0.40–1.20)
GFR: 80.36 mL/min (ref >60.00)
Glucose, Bld: 100 mg/dL — ABNORMAL HIGH (ref 70–99)
Potassium: 4.3 mEq/L (ref 3.5–5.1)
Sodium: 137 mEq/L (ref 135–145)
Total Bilirubin: 0.8 mg/dL (ref 0.2–1.2)
Total Protein: 7.1 g/dL (ref 6.0–8.3)

## 2019-05-28 LAB — URINALYSIS, ROUTINE W REFLEX MICROSCOPIC
Bilirubin Urine: NEGATIVE
Ketones, ur: NEGATIVE
Nitrite: NEGATIVE
Specific Gravity, Urine: >=1.03 — AB (ref 1.000–1.030)
Urine Glucose: NEGATIVE
Urobilinogen, UA: 0.2 (ref 0.0–1.0)
pH: 5.5 (ref 5.0–8.0)

## 2019-05-28 LAB — CBC
HCT: 41.7 % (ref 36.0–46.0)
Hemoglobin: 14.4 g/dL (ref 12.0–15.0)
MCHC: 34.5 g/dL (ref 30.0–36.0)
MCV: 92.1 fl (ref 78.0–100.0)
Platelets: 233 10*3/uL (ref 150.0–400.0)
RBC: 4.53 Mil/uL (ref 3.87–5.11)
RDW: 12.6 % (ref 11.5–15.5)
WBC: 6 10*3/uL (ref 4.0–10.5)

## 2019-05-28 LAB — HEMOGLOBIN A1C: Hgb A1c MFr Bld: 5.1 % (ref 4.6–6.5)

## 2019-05-28 LAB — LDL CHOLESTEROL, DIRECT: Direct LDL: 83 mg/dL

## 2019-05-28 LAB — VITAMIN D 25 HYDROXY (VIT D DEFICIENCY, FRACTURES): VITD: 53.91 ng/mL (ref 30.00–100.00)

## 2019-06-26 ENCOUNTER — Ambulatory Visit: Payer: 59 | Admitting: Family Medicine

## 2019-08-08 ENCOUNTER — Telehealth: Payer: Self-pay | Admitting: Family Medicine

## 2019-08-08 NOTE — Telephone Encounter (Signed)
Called patient to reschedule missed appt from 06/26/19. Left voicemail for patient to call and schedule.

## 2019-12-29 ENCOUNTER — Ambulatory Visit (INDEPENDENT_AMBULATORY_CARE_PROVIDER_SITE_OTHER): Payer: 59 | Admitting: Nurse Practitioner

## 2019-12-29 ENCOUNTER — Encounter: Payer: Self-pay | Admitting: Nurse Practitioner

## 2019-12-29 ENCOUNTER — Other Ambulatory Visit: Payer: Self-pay

## 2019-12-29 VITALS — BP 138/82 | HR 70 | Temp 97.2°F | Ht 65.0 in | Wt 211.8 lb

## 2019-12-29 DIAGNOSIS — L209 Atopic dermatitis, unspecified: Secondary | ICD-10-CM | POA: Diagnosis not present

## 2019-12-29 DIAGNOSIS — Z23 Encounter for immunization: Secondary | ICD-10-CM | POA: Diagnosis not present

## 2019-12-29 MED ORDER — NYSTATIN-TRIAMCINOLONE 100000-0.1 UNIT/GM-% EX OINT: 1.0000 "application " | TOPICAL_OINTMENT | Freq: Two times a day (BID) | CUTANEOUS | 0 refills | Status: DC

## 2019-12-29 NOTE — Progress Notes (Signed)
   Subjective:  Patient ID: Alexis Alvarado, female    DOB: 23-Mar-1975  Age: 44 y.o. MRN: [id number]  CC: Acute Visit (Pt c/o spot on back x, pt states she has noticed the spot x3+ weeks, pt states it keep changing characteristics one day it was red and puffy another day yellowish color, feels like a dry spot, raised bumpy feeling  )  Rash This is a new problem. The current episode started 1 to 4 weeks ago. The problem is unchanged. The affected locations include the back. The rash is characterized by itchiness, scaling and redness. It is unknown if there was an exposure to a precipitant. Pertinent negatives include no fatigue or fever. Past treatments include nothing. There is no history of allergies or varicella.   Reviewed past Medical, Social and Family history today.  Outpatient Medications Prior to Visit  Medication Sig Dispense Refill  . diclofenac Sodium (VOLTAREN) 1 % GEL Apply a grape sized dollop 3 times daily to right heel. 150 g 1  . levonorgestrel (MIRENA, 52 MG,) 20 MCG/24HR IUD Mirena 20 mcg/24 hours (6 yrs) 52 mg intrauterine device  Take 1 device by intrauterine route.    Marland Kitchen levothyroxine (SYNTHROID) 150 MCG tablet Take 1 tablet in AM on empty stomach once a day for thyroid (name brand only DAW)    . levothyroxine (SYNTHROID) 137 MCG tablet Take 1 tablet by mouth daily.    Marland Kitchen levothyroxine (SYNTHROID, LEVOTHROID) 75 MCG tablet Take 75 mcg by mouth daily before breakfast.     No facility-administered medications prior to visit.    ROS See HPI  Objective:  BP 138/82 (BP Location: Right Arm, Patient Position: Sitting, Cuff Size: Normal)   Pulse 70   Temp (!) 97.2 F (36.2 C) (Temporal)   Ht 5\' 5"  (1.651 m)   Wt 211 lb 12.8 oz (96.1 kg)   SpO2 98%   BMI 35.25 kg/m   Physical Exam Vitals reviewed.  Skin:    Findings: Rash present. Rash is macular and scaling.       Neurological:     Mental Status: She is alert and oriented to person, place, and time.     Assessment & Plan:  This visit occurred during the SARS-CoV-2 public health emergency.  Safety protocols were in place, including screening questions prior to the visit, additional usage of staff PPE, and extensive cleaning of exam room while observing appropriate contact time as indicated for disinfecting solutions.   Alexis Alvarado was seen today for acute visit.  Diagnoses and all orders for this visit:  Atopic dermatitis, unspecified type -     nystatin-triamcinolone ointment (MYCOLOG); Apply 1 application topically 2 (two) times daily.  Influenza vaccine needed -     Cancel: Flu Vaccine QUAD High Dose(Fluad) -     Flu Vaccine QUAD 6+ mos PF IM (Fluarix Quad PF)    Problem List Items Addressed This Visit    None    Visit Diagnoses    Atopic dermatitis, unspecified type    -  Primary   Relevant Medications   nystatin-triamcinolone ointment (MYCOLOG)   Influenza vaccine needed       Relevant Orders   Flu Vaccine QUAD 6+ mos PF IM (Fluarix Quad PF) (Completed)      Follow-up: No follow-ups on file.  Victorino Dike, NP

## 2019-12-29 NOTE — Patient Instructions (Signed)
Call office for dermatology referral if no improvement in 1week.

## 2020-03-25 ENCOUNTER — Telehealth (INDEPENDENT_AMBULATORY_CARE_PROVIDER_SITE_OTHER): Payer: PRIVATE HEALTH INSURANCE | Admitting: Family Medicine

## 2020-03-25 ENCOUNTER — Ambulatory Visit: Payer: 59 | Admitting: Family Medicine

## 2020-03-25 DIAGNOSIS — R0981 Nasal congestion: Secondary | ICD-10-CM

## 2020-03-25 DIAGNOSIS — R0982 Postnasal drip: Secondary | ICD-10-CM | POA: Diagnosis not present

## 2020-03-25 MED ORDER — FLUTICASONE PROPIONATE 50 MCG/ACT NA SUSP: 2.0000 | Freq: Every day | NASAL | 6 refills | Status: DC

## 2020-03-25 NOTE — Progress Notes (Signed)
Virtual Visit via Video Note  I connected with Alexis Alvarado  on 03/25/20 at  6:00 PM EST by a video enabled telemedicine application and verified that I am speaking with the correct person using two identifiers.  Location patient: home, [place] Location provider:work or home office Persons participating in the virtual visit: patient, provider  I discussed the limitations of evaluation and management by telemedicine and the availability of in person appointments. The patient expressed understanding and agreed to proceed.   HPI:  Acute telemedicine visit for cough and nasal congestion: -Onset: 7 days -has had several negative covid test -Symptoms include: pnd, runny nose, scratchy throat and this makes her cough -Denies:fevers, chills, Malaise, body aches -Has tried:nyquil -Pertinent past medical history: reports history of seasonal allergies -Pertinent medication allergies:dexamethasone allergy listed - however patient reports does not have an allergy to this and she reports it should be taken off of her chart. Reports has had no issue taking steroids. -COVID-19 vaccine status: fully vaccinated + booster  ROS: See pertinent positives and negatives per HPI.  Past Medical History:  Diagnosis Date  . Anxiety   . Chronic back pain   . Hypothyroidism   . Thyroid disease    hypothyroid    Past Surgical History:  Procedure Laterality Date  . LEG SURGERY    . TUBAL LIGATION    . tubual reversal       Current Outpatient Medications:  .  fluticasone (FLONASE) 50 MCG/ACT nasal spray, Place 2 sprays into both nostrils daily., Disp: 16 g, Rfl: 6 .  diclofenac Sodium (VOLTAREN) 1 % GEL, Apply a grape sized dollop 3 times daily to right heel., Disp: 150 g, Rfl: 1 .  levonorgestrel (MIRENA, 52 MG,) 20 MCG/24HR IUD, Mirena 20 mcg/24 hours (6 yrs) 52 mg intrauterine device  Take 1 device by intrauterine route., Disp: , Rfl:  .  levothyroxine (SYNTHROID) 137 MCG tablet, Take 1 tablet by mouth  daily., Disp: , Rfl:  .  levothyroxine (SYNTHROID) 150 MCG tablet, Take 1 tablet in AM on empty stomach once a day for thyroid (name brand only DAW), Disp: , Rfl:  .  nystatin-triamcinolone ointment (MYCOLOG), Apply 1 application topically 2 (two) times daily., Disp: 30 g, Rfl: 0  EXAM:  VITALS per patient if applicable:  GENERAL: alert, oriented, appears well and in no acute distress  HEENT: atraumatic, conjunttiva clear, no obvious abnormalities on inspection of external nose and ears  NECK: normal movements of the head and neck  LUNGS: on inspection no signs of respiratory distress, breathing rate appears normal, no obvious gross SOB, gasping or wheezing  CV: no obvious cyanosis  MS: moves all visible extremities without noticeable abnormality  PSYCH/NEURO: pleasant and cooperative, no obvious depression or anxiety, speech and thought processing grossly intact  ASSESSMENT AND PLAN:  Discussed the following assessment and plan:  Nasal congestion  PND (post-nasal drip)  -we discussed possible serious and likely etiologies, options for evaluation and workup, limitations of telemedicine visit vs in person visit, treatment, treatment risks and precautions. Pt prefers to treat via telemedicine empirically rather than in person at this moment.  Query viral upper respiratory illness versus seasonal allergies versus other.  Opted to try allergy regimen and nasal saline.  Advised Allegra once daily.  Also talked about INS.  She has a listed allergy to dexamethasone, however reports she does not have an allergy to this and has taken steroids without issues.  Did warn her that if she had an allergy to steroids,  she possibly could not take an INS could have a reaction to that as well.  She opted to try Flonase. Scheduled follow up with PCP offered: Agrees to schedule follow-up if needed through primary care office. Advised to seek prompt in person care if worsening, new symptoms arise, or if is  not improving with treatment. Discussed options for inperson care if PCP office not available. Did let this patient know that I only do telemedicine on Tuesdays and Thursdays for [place] [place]. Advised to schedule follow up visit with PCP or [facility] if any further questions or concerns to avoid delays in care.   I discussed the assessment and treatment plan with the patient. The patient was provided an opportunity to ask questions and all were answered. The patient agreed with the plan and demonstrated an understanding of the instructions.     Terressa Koyanagi, DO

## 2020-03-25 NOTE — Patient Instructions (Addendum)
-  start Allegra and take once daily  -could also try flonase (2 sprays each nostril daily for the first 3 weeks) then 1 spray each nostril daily. This is a steroid that is related to dexamethasone. If you did have an allergic reaction to dexamethasone it is possible to also be allergic to this.   -nasal saline twice daily  I hope you are feeling better soon!  Seek in person care promptly if your symptoms worsen, new concerns arise or you are not improving with treatment.  It was nice to meet you today. I help [facility] out with telemedicine visits on Tuesdays and Thursdays and am available for visits on those days. If you have any concerns or questions following this visit please schedule a follow up visit with your Primary Care doctor or seek care at a local urgent care clinic to avoid delays in care.

## 2020-05-11 ENCOUNTER — Ambulatory Visit: Payer: 59 | Admitting: Family Medicine

## 2020-05-17 ENCOUNTER — Other Ambulatory Visit: Payer: Self-pay

## 2020-05-18 ENCOUNTER — Ambulatory Visit (INDEPENDENT_AMBULATORY_CARE_PROVIDER_SITE_OTHER): Payer: PRIVATE HEALTH INSURANCE | Admitting: Family Medicine

## 2020-05-18 ENCOUNTER — Encounter: Payer: Self-pay | Admitting: Family Medicine

## 2020-05-18 VITALS — BP 136/74 | HR 92 | Temp 97.1°F | Ht 65.0 in | Wt 215.8 lb

## 2020-05-18 DIAGNOSIS — E039 Hypothyroidism, unspecified: Secondary | ICD-10-CM

## 2020-05-18 DIAGNOSIS — Z8632 Personal history of gestational diabetes: Secondary | ICD-10-CM | POA: Diagnosis not present

## 2020-05-18 NOTE — Progress Notes (Addendum)
Established Patient Office Visit  Subjective:  Patient ID: Alexis Alvarado, female    DOB: Nov 27, 1975  Age: 45 y.o. MRN: [id number]  CC:  Chief Complaint  Patient presents with  . Follow-up    Patient would like to discuss thyroid disease.     HPI Alexis Alvarado presents for follow-up of her hypothyroidism.  She is been under the treatment of the endocrinologist who is retiring.  Currently stable on trade Synthroid 150 mcg.  New orders this medication from a special warehouse.  She does not want to take generic levothyroxine.  We discussed her recent blood work.  Glucose has been mildly elevated.  She did experience gestational diabetes with her fifth pregnancy.  Her father was recently diagnosed with borderline diabetes.  She has had no frequency of urination or weight loss.  Past Medical History:  Diagnosis Date  . Anxiety   . Chronic back pain   . Hypothyroidism   . Thyroid disease    hypothyroid    Past Surgical History:  Procedure Laterality Date  . LEG SURGERY    . TUBAL LIGATION    . tubual reversal      Family History  Problem Relation Age of Onset  . Hypertension Father   . COPD Mother   . Thyroid disease Maternal Grandmother     Social History   Socioeconomic History  . Marital status: Single    Spouse name: Not on file  . Number of children: Not on file  . Years of education: Not on file  . Highest education level: Not on file  Occupational History  . Not on file  Tobacco Use  . Smoking status: Former Smoker    Quit date: 09/15/2000    Years since quitting: 19.7  . Smokeless tobacco: Never Used  Vaping Use  . Vaping Use: Never used  Substance and Sexual Activity  . Alcohol use: No    Comment: occassional before pregnancy  . Drug use: No  . Sexual activity: Yes    Birth control/protection: None, I.U.D.  Other Topics Concern  . Not on file  Social History Narrative  . Not on file   Social Determinants of Health   Financial Resource Strain:  Not on file  Food Insecurity: Not on file  Transportation Needs: Not on file  Physical Activity: Not on file  Stress: Not on file  Social Connections: Not on file  Intimate Partner Violence: Not on file    Outpatient Medications Prior to Visit  Medication Sig Dispense Refill  . diclofenac Sodium (VOLTAREN) 1 % GEL Apply a grape sized dollop 3 times daily to right heel. 150 g 1  . fluticasone (FLONASE) 50 MCG/ACT nasal spray Place 2 sprays into both nostrils daily. 16 g 6  . levonorgestrel (MIRENA, 52 MG,) 20 MCG/24HR IUD Mirena 20 mcg/24 hours (6 yrs) 52 mg intrauterine device  Take 1 device by intrauterine route.    Marland Kitchen levothyroxine (SYNTHROID) 150 MCG tablet Take 1 tablet in AM on empty stomach once a day for thyroid (name brand only DAW)    . levothyroxine (SYNTHROID) 150 MCG tablet Take 150 mcg by mouth daily before breakfast.    . levothyroxine (SYNTHROID) 137 MCG tablet Take 1 tablet by mouth daily.    Marland Kitchen nystatin-triamcinolone ointment (MYCOLOG) Apply 1 application topically 2 (two) times daily. 30 g 0   No facility-administered medications prior to visit.    Allergies  Allergen Reactions  . Dexamethasone Rash    ROS Review of  Systems  Constitutional: Negative.   HENT: Negative.   Respiratory: Negative.   Cardiovascular: Negative.   Gastrointestinal: Negative.   Endocrine: Negative for cold intolerance, heat intolerance, polyphagia and polyuria.  Genitourinary: Negative.   Psychiatric/Behavioral: Negative.       Objective:    Physical Exam Vitals and nursing note reviewed.  Constitutional:      General: She is not in acute distress.    Appearance: Normal appearance. She is not ill-appearing, toxic-appearing or diaphoretic.  HENT:     Head: Normocephalic and atraumatic.     Right Ear: Tympanic membrane, ear canal and external ear normal.     Left Ear: Tympanic membrane, ear canal and external ear normal.     Mouth/Throat:     Mouth: Mucous membranes are moist.      Pharynx: Oropharynx is clear. No oropharyngeal exudate or posterior oropharyngeal erythema.  Eyes:     General: No scleral icterus.       Right eye: No discharge.        Left eye: No discharge.     Extraocular Movements: Extraocular movements intact.     Conjunctiva/sclera: Conjunctivae normal.     Pupils: Pupils are equal, round, and reactive to light.  Neck:     Vascular: No carotid bruit.  Cardiovascular:     Rate and Rhythm: Normal rate and regular rhythm.  Pulmonary:     Effort: Pulmonary effort is normal.     Breath sounds: Normal breath sounds.  Musculoskeletal:     Cervical back: No rigidity or tenderness.  Lymphadenopathy:     Cervical: No cervical adenopathy.  Skin:    General: Skin is warm and dry.  Neurological:     Mental Status: She is alert and oriented to person, place, and time.  Psychiatric:        Mood and Affect: Mood normal.        Behavior: Behavior normal.     BP 136/74   Pulse 92   Temp (!) 97.1 F (36.2 C) (Temporal)   Ht 5\' 5"  (1.651 m)   Wt 215 lb 12.8 oz (97.9 kg)   SpO2 97%   BMI 35.91 kg/m  Wt Readings from Last 3 Encounters:  05/18/20 215 lb 12.8 oz (97.9 kg)  12/29/19 211 lb 12.8 oz (96.1 kg)  05/26/19 203 lb 12.8 oz (92.4 kg)     Health Maintenance Due  Topic Date Due  . Hepatitis C Screening  Never done    There are no preventive care reminders to display for this patient.  Lab Results  Component Value Date   TSH 0.34 (L) 05/18/2020   Lab Results  Component Value Date   WBC 6.0 05/28/2019   HGB 14.4 05/28/2019   HCT 41.7 05/28/2019   MCV 92.1 05/28/2019   PLT 233.0 05/28/2019   Lab Results  Component Value Date   NA 140 05/18/2020   K 4.1 05/18/2020   CO2 26 05/18/2020   GLUCOSE 81 05/18/2020   BUN 13 05/18/2020   CREATININE 1.02 05/18/2020   BILITOT 0.8 05/28/2019   ALKPHOS 64 05/28/2019   AST 20 05/28/2019   ALT 6 05/28/2019   PROT 7.1 05/28/2019   ALBUMIN 4.4 05/28/2019   CALCIUM 9.4 05/18/2020    ANIONGAP 5 01/08/2019   GFR 66.85 05/18/2020   Lab Results  Component Value Date   CHOL 164 05/28/2019   Lab Results  Component Value Date   HDL 71.50 05/28/2019   Lab Results  Component  Value Date   LDLCALC 81 05/28/2019   Lab Results  Component Value Date   TRIG 59.0 05/28/2019   Lab Results  Component Value Date   CHOLHDL 2 05/28/2019   Lab Results  Component Value Date   HGBA1C 5.7 05/18/2020      Assessment & Plan:   Problem List Items Addressed This Visit      Endocrine   Acquired hypothyroidism - Primary   Relevant Medications   levothyroxine (SYNTHROID) 137 MCG tablet   Other Relevant Orders   TSH (Completed)   TSH     Other   History of gestational diabetes   Relevant Orders   Basic metabolic panel (Completed)   Hemoglobin A1c (Completed)      Meds ordered this encounter  Medications  . levothyroxine (SYNTHROID) 137 MCG tablet    Sig: Take 1 tablet (137 mcg total) by mouth daily before breakfast.    Dispense:  100 tablet    Refill:  1    Follow-up: Return in about 6 months (around 11/17/2020).  She will need trade Synthroid sent to a particular drug warehouse.  Discussed the importance of taking the Synthroid on a fasting stomach each morning 30 minutes prior to eating and an hour before coffee.  Mliss Sax, MD   5/3 addendum: TSH is low. Have adjusted dose of Synthroid down to . Fu in 8 weeks.

## 2020-05-19 LAB — BASIC METABOLIC PANEL
BUN: 13 mg/dL (ref 6–23)
CO2: 26 mEq/L (ref 19–32)
Calcium: 9.4 mg/dL (ref 8.4–10.5)
Chloride: 104 mEq/L (ref 96–112)
Creatinine, Ser: 1.02 mg/dL (ref 0.40–1.20)
GFR: 66.85 mL/min (ref >60.00)
Glucose, Bld: 81 mg/dL (ref 70–99)
Potassium: 4.1 mEq/L (ref 3.5–5.1)
Sodium: 140 mEq/L (ref 135–145)

## 2020-05-19 LAB — TSH: TSH: 0.34 u[IU]/mL — ABNORMAL LOW (ref 0.35–4.50)

## 2020-05-19 LAB — HEMOGLOBIN A1C: Hgb A1c MFr Bld: 5.7 % (ref 4.6–6.5)

## 2020-05-25 MED ORDER — LEVOTHYROXINE SODIUM 137 MCG PO TABS: 137.0000 ug | ORAL_TABLET | Freq: Every day | ORAL | 1 refills | Status: DC

## 2020-05-25 NOTE — Addendum Note (Signed)
Addended by: Andrez Grime on: 05/25/2020 07:51 AM   Modules accepted: Orders

## 2020-05-27 ENCOUNTER — Telehealth: Payer: Self-pay

## 2020-05-27 DIAGNOSIS — E039 Hypothyroidism, unspecified: Secondary | ICD-10-CM

## 2020-05-27 NOTE — Telephone Encounter (Signed)
Please advise were you able to pull up Synthroid only prescription? I have tried but did not work. I can fax if you are able to print off.

## 2020-05-27 NOTE — Telephone Encounter (Signed)
Pt Needs the brand name Synthroid sent in. She's in a synthroid program and cannot take Levothyroxine.   She needs the script faxed to: [phone]

## 2020-05-28 MED ORDER — SYNTHROID 137 MCG PO TABS: 137.0000 ug | ORAL_TABLET | Freq: Every day | ORAL | 1 refills | Status: DC

## 2020-07-04 ENCOUNTER — Ambulatory Visit (HOSPITAL_COMMUNITY)
Admission: EM | Admit: 2020-07-04 | Discharge: 2020-07-04 | Disposition: A | Discharge status: Home / Self Care | Payer: PRIVATE HEALTH INSURANCE | Attending: Emergency Medicine | Admitting: Emergency Medicine

## 2020-07-04 ENCOUNTER — Other Ambulatory Visit: Payer: Self-pay

## 2020-07-04 ENCOUNTER — Encounter (HOSPITAL_COMMUNITY): Payer: Self-pay

## 2020-07-04 DIAGNOSIS — B029 Zoster without complications: Secondary | ICD-10-CM

## 2020-07-04 MED ORDER — VALACYCLOVIR HCL 1 G PO TABS: 1000.0000 mg | ORAL_TABLET | Freq: Three times a day (TID) | ORAL | 0 refills | Status: AC

## 2020-07-04 NOTE — ED Provider Notes (Signed)
[facility]    CSN: [id number] Arrival date & time: 07/04/20  1730      History   Chief Complaint Chief Complaint  Patient presents with   Rash    HPI Alexis Alvarado is a 45 y.o. female.   Patient here for evaluation of rash located on left side of her back that for started today.  Reports having some tingling to her back that started few days ago.  Denies any itchiness but does support pain.  Denies any trauma, injury, or other precipitating event.  Denies any specific alleviating or aggravating factors.  Denies any fevers, chest pain, shortness of breath, N/V/D, numbness, tingling, weakness, abdominal pain, or headaches.     The history is provided by the patient.  Rash  Past Medical History:  Diagnosis Date   Anxiety    Chronic back pain    Hypothyroidism    Thyroid disease    hypothyroid    Patient Active Problem List   Diagnosis Date Noted   History of gestational diabetes 05/18/2020   Plantar fasciitis of right foot 05/26/2019   Spontaneous vaginal delivery 11/22/2013   Labor and delivery, indication for care 11/21/2013   Acquired hypothyroidism 04/11/2013    Past Surgical History:  Procedure Laterality Date   LEG SURGERY     TUBAL LIGATION     tubual reversal      OB History     Gravida  5   Para  5   Term  4   Preterm  1   AB      Living  5      SAB      IAB      Ectopic      Multiple      Live Births  5            Home Medications    Prior to Admission medications   Medication Sig Start Date End Date Taking? Authorizing Provider  valACYclovir (VALTREX) 1000 MG tablet Take 1 tablet (1,000 mg total) by mouth 3 (three) times daily for 7 days. 07/04/20 07/11/20 Yes Ivette Loyal, NP  diclofenac Sodium (VOLTAREN) 1 % GEL Apply a grape sized dollop 3 times daily to right heel. 05/26/19   Mliss Sax, MD  fluticasone [facility]) 50 MCG/ACT nasal spray Place 2 sprays into both nostrils daily. 03/25/20   Terressa Koyanagi, DO  levonorgestrel (MIRENA, 52 MG,) 20 MCG/24HR IUD Mirena 20 mcg/24 hours (6 yrs) 52 mg intrauterine device  Take 1 device by intrauterine route.    [provider]  SYNTHROID 137 MCG tablet Take 1 tablet (137 mcg total) by mouth daily before breakfast. 05/28/20   Mliss Sax, MD    Family History Family History  Problem Relation Age of Onset   Hypertension Father    COPD Mother    Thyroid disease Maternal Grandmother     Social History Social History   Tobacco Use   Smoking status: Former    Pack years: 0.00    Types: Cigarettes    Quit date: 09/15/2000    Years since quitting: 19.8   Smokeless tobacco: Never  Vaping Use   Vaping Use: Never used  Substance Use Topics   Alcohol use: No    Comment: occassional before pregnancy   Drug use: No     Allergies   Dexamethasone   Review of Systems Review of Systems  Skin:  Positive for rash.  All other systems reviewed and  are negative.   Physical Exam Triage Vital Signs ED Triage Vitals  Enc Vitals Group     BP 07/04/20 1752 (!) 157/82     Pulse Rate 07/04/20 1752 72     Resp 07/04/20 1752 17     Temp 07/04/20 1752 98.7 F (37.1 C)     Temp Source 07/04/20 1752 Oral     SpO2 07/04/20 1752 97 %     Weight --      Height --      Head Circumference --      Peak Flow --      Pain Score 07/04/20 1749 7     Pain Loc --      Pain Edu? --      Excl. in GC? --    No data found.  Updated Vital Signs BP (!) 157/82 (BP Location: Left Arm)   Pulse 72   Temp 98.7 F (37.1 C) (Oral)   Resp 17   SpO2 97%   Visual Acuity Right Eye Distance:   Left Eye Distance:   Bilateral Distance:    Right Eye Near:   Left Eye Near:    Bilateral Near:     Physical Exam Vitals and nursing note reviewed.  Constitutional:      General: She is not in acute distress.    Appearance: Normal appearance. She is not ill-appearing, toxic-appearing or diaphoretic.  HENT:     Head: Normocephalic and  atraumatic.  Eyes:     Conjunctiva/sclera: Conjunctivae normal.  Cardiovascular:     Rate and Rhythm: Normal rate.     Pulses: Normal pulses.  Pulmonary:     Effort: Pulmonary effort is normal.  Abdominal:     General: Abdomen is flat.  Musculoskeletal:        General: Normal range of motion.     Cervical back: Normal range of motion.  Skin:    General: Skin is warm and dry.     Findings: Rash (see photo) present.  Neurological:     General: No focal deficit present.     Mental Status: She is alert and oriented to person, place, and time.  Psychiatric:        Mood and Affect: Mood normal.      UC Treatments / Results  Labs (all labs ordered are listed, but only abnormal results are displayed) Labs Reviewed - No data to display  EKG   Radiology No results found.  Procedures Procedures (including critical care time)  Medications Ordered in UC Medications - No data to display  Initial Impression / Assessment and Plan / UC Course  I have reviewed the triage vital signs and the nursing notes.  Pertinent labs & imaging results that were available during my care of the patient were reviewed by me and considered in my medical decision making (see chart for details).    Assessment negative for red flags or concerns.  This is herpes zoster without complication.  Prescribed Valtrex 3 times a day for the next 7 days.  Patient given information on shingles care.  May use cool compresses or showers to help.  May use anti-itch numbing cream for symptom relief.  Follow-up with primary care as needed Final Clinical Impressions(s) / UC Diagnoses   Final diagnoses:  Herpes zoster without complication     Discharge Instructions      Take the Valtrex three times a day for the next 7 days.    Return or go to the Emergency  Department if symptoms worsen or do not improve in the next few days.       ED Prescriptions     Medication Sig Dispense Auth. Provider    valACYclovir (VALTREX) 1000 MG tablet Take 1 tablet (1,000 mg total) by mouth 3 (three) times daily for 7 days. 21 tablet Ivette Loyal, NP      PDMP not reviewed this encounter.   Ivette Loyal, NP 07/04/20 1824

## 2020-07-04 NOTE — ED Triage Notes (Signed)
Pt in with c/o painful rash to left side of her back that she noticed today  States the pain started earlier this week before she noticed the rash

## 2020-07-04 NOTE — Discharge Instructions (Addendum)
Take the Valtrex three times a day for the next 7 days.    Return or go to the Emergency Department if symptoms worsen or do not improve in the next few days.

## 2020-08-11 ENCOUNTER — Ambulatory Visit (INDEPENDENT_AMBULATORY_CARE_PROVIDER_SITE_OTHER): Payer: PRIVATE HEALTH INSURANCE | Admitting: Family Medicine

## 2020-08-11 ENCOUNTER — Other Ambulatory Visit: Payer: Self-pay

## 2020-08-11 ENCOUNTER — Encounter: Payer: Self-pay | Admitting: Family Medicine

## 2020-08-11 VITALS — BP 120/82 | HR 61 | Temp 97.8°F | Ht 65.0 in | Wt 216.6 lb

## 2020-08-11 DIAGNOSIS — E039 Hypothyroidism, unspecified: Secondary | ICD-10-CM | POA: Diagnosis not present

## 2020-08-11 DIAGNOSIS — Z0000 Encounter for general adult medical examination without abnormal findings: Secondary | ICD-10-CM

## 2020-08-11 LAB — COMPREHENSIVE METABOLIC PANEL
ALT: 19 U/L (ref 0–35)
AST: 14 U/L (ref 0–37)
Albumin: 4.1 g/dL (ref 3.5–5.2)
Alkaline Phosphatase: 50 U/L (ref 39–117)
BUN: 12 mg/dL (ref 6–23)
CO2: 26 mEq/L (ref 19–32)
Calcium: 9.2 mg/dL (ref 8.4–10.5)
Chloride: 104 mEq/L (ref 96–112)
Creatinine, Ser: 0.81 mg/dL (ref 0.40–1.20)
GFR: 88.01 mL/min (ref >60.00)
Glucose, Bld: 89 mg/dL (ref 70–99)
Potassium: 4.2 mEq/L (ref 3.5–5.1)
Sodium: 138 mEq/L (ref 135–145)
Total Bilirubin: 0.3 mg/dL (ref 0.2–1.2)
Total Protein: 6.4 g/dL (ref 6.0–8.3)

## 2020-08-11 LAB — CBC
HCT: 39.8 % (ref 36.0–46.0)
Hemoglobin: 13.7 g/dL (ref 12.0–15.0)
MCHC: 34.3 g/dL (ref 30.0–36.0)
MCV: 84 fl (ref 78.0–100.0)
Platelets: 175 10*3/uL (ref 150.0–400.0)
RBC: 4.74 Mil/uL (ref 3.87–5.11)
RDW: 14.2 % (ref 11.5–15.5)
WBC: 6.7 10*3/uL (ref 4.0–10.5)

## 2020-08-11 LAB — LIPID PANEL
Cholesterol: 219 mg/dL — ABNORMAL HIGH (ref 0–200)
HDL: 48.6 mg/dL (ref >39.00)
LDL Cholesterol: 135 mg/dL — ABNORMAL HIGH (ref 0–99)
NonHDL: 170.16
Total CHOL/HDL Ratio: 5
Triglycerides: 178 mg/dL — ABNORMAL HIGH (ref 0.0–149.0)
VLDL: 35.6 mg/dL (ref 0.0–40.0)

## 2020-08-11 LAB — URINALYSIS, ROUTINE W REFLEX MICROSCOPIC
Bilirubin Urine: NEGATIVE
Ketones, ur: NEGATIVE
Leukocytes,Ua: NEGATIVE
Nitrite: NEGATIVE
Specific Gravity, Urine: >=1.03 — AB (ref 1.000–1.030)
Total Protein, Urine: NEGATIVE
Urine Glucose: NEGATIVE
Urobilinogen, UA: 0.2 (ref 0.0–1.0)
pH: 5.5 (ref 5.0–8.0)

## 2020-08-11 LAB — TSH: TSH: 6.14 u[IU]/mL — ABNORMAL HIGH (ref 0.35–5.50)

## 2020-08-11 LAB — T4, FREE: Free T4: 0.9 ng/dL (ref 0.60–1.60)

## 2020-08-11 LAB — T3, FREE: T3, Free: 3.6 pg/mL (ref 2.3–4.2)

## 2020-08-11 NOTE — Progress Notes (Addendum)
Established Patient Office Visit  Subjective:  Patient ID: Alexis Alvarado, female    DOB: 11/03/1975  Age: 45 y.o. MRN: [id number]  CC:  Chief Complaint  Patient presents with   Follow-up    Follow up on thyroid, patient fasting. Per patient new medication make her very tired and restless.     HPI Alexis Alvarado presents for follow-up of her hypothyroidism and a physical exam.  Patient is seeing GYN for female care.  Otherwise up-to-date on health maintenance.  Status post lowering her Synthroid dose from to 137 mcg.  She has noticed fatigue, hair loss, constipation.  She did receive titrate Synthroid.  Past Medical History:  Diagnosis Date   Anxiety    Chronic back pain    Hypothyroidism    Thyroid disease    hypothyroid    Past Surgical History:  Procedure Laterality Date   LEG SURGERY     TUBAL LIGATION     tubual reversal      Family History  Problem Relation Age of Onset   Hypertension Father    COPD Mother    Thyroid disease Maternal Grandmother     Social History   Socioeconomic History   Marital status: Single    Spouse name: Not on file   Number of children: Not on file   Years of education: Not on file   Highest education level: Not on file  Occupational History   Not on file  Tobacco Use   Smoking status: Former    Types: Cigarettes    Quit date: 09/15/2000    Years since quitting: 19.9   Smokeless tobacco: Never  Vaping Use   Vaping Use: Never used  Substance and Sexual Activity   Alcohol use: No    Comment: occassional before pregnancy   Drug use: No   Sexual activity: Yes    Birth control/protection: None, I.U.D.  Other Topics Concern   Not on file  Social History Narrative   Not on file   Social Determinants of Health   Financial Resource Strain: Not on file  Food Insecurity: Not on file  Transportation Needs: Not on file  Physical Activity: Not on file  Stress: Not on file  Social Connections: Not on file  Intimate  Partner Violence: Not on file    Outpatient Medications Prior to Visit  Medication Sig Dispense Refill   diclofenac Sodium (VOLTAREN) 1 % GEL Apply a grape sized dollop 3 times daily to right heel. 150 g 1   fluticasone (FLONASE) 50 MCG/ACT nasal spray Place 2 sprays into both nostrils daily. 16 g 6   levonorgestrel (MIRENA, 52 MG,) 20 MCG/24HR IUD Mirena 20 mcg/24 hours (6 yrs) 52 mg intrauterine device  Take 1 device by intrauterine route.     SYNTHROID 137 MCG tablet Take 1 tablet (137 mcg total) by mouth daily before breakfast. 90 tablet 1   No facility-administered medications prior to visit.    Allergies  Allergen Reactions   Dexamethasone Rash    Patient states she is not allergic.     ROS Review of Systems  Constitutional:  Positive for fatigue. Negative for diaphoresis, fever and unexpected weight change.  HENT: Negative.    Eyes:  Negative for photophobia and visual disturbance.  Respiratory: Negative.    Cardiovascular: Negative.   Gastrointestinal:  Positive for constipation. Negative for abdominal pain.  Endocrine: Negative for cold intolerance and heat intolerance.  Genitourinary: Negative.   Musculoskeletal:  Negative for gait problem and joint  swelling.  Neurological:  Negative for speech difficulty and weakness.  Psychiatric/Behavioral: Negative.       Objective:    Physical Exam Vitals and nursing note reviewed.  Constitutional:      General: She is not in acute distress.    Appearance: Normal appearance. She is not ill-appearing, toxic-appearing or diaphoretic.  HENT:     Head: Normocephalic and atraumatic.     Right Ear: Tympanic membrane, ear canal and external ear normal.     Left Ear: Tympanic membrane, ear canal and external ear normal.     Mouth/Throat:     Mouth: Mucous membranes are moist.     Pharynx: Oropharynx is clear. No oropharyngeal exudate or posterior oropharyngeal erythema.  Eyes:     General: No scleral icterus.       Right eye:  No discharge.        Left eye: No discharge.     Extraocular Movements: Extraocular movements intact.     Conjunctiva/sclera: Conjunctivae normal.     Pupils: Pupils are equal, round, and reactive to light.  Cardiovascular:     Rate and Rhythm: Normal rate and regular rhythm.  Pulmonary:     Effort: Pulmonary effort is normal.     Breath sounds: Normal breath sounds.  Abdominal:     General: Bowel sounds are normal.  Musculoskeletal:     Cervical back: No rigidity or tenderness.  Lymphadenopathy:     Cervical: No cervical adenopathy.  Skin:    General: Skin is warm and dry.  Neurological:     Mental Status: She is alert and oriented to person, place, and time.  Psychiatric:        Mood and Affect: Mood normal.        Behavior: Behavior normal.    BP 120/82   Pulse 61   Temp 97.8 F (36.6 C) (Temporal)   Ht 5\' 5"  (1.651 m)   Wt 216 lb 9.6 oz (98.2 kg)   SpO2 99%   BMI 36.04 kg/m  Wt Readings from Last 3 Encounters:  08/11/20 216 lb 9.6 oz (98.2 kg)  05/18/20 215 lb 12.8 oz (97.9 kg)  12/29/19 211 lb 12.8 oz (96.1 kg)     Health Maintenance Due  Topic Date Due   Hepatitis C Screening  Never done    There are no preventive care reminders to display for this patient.  Lab Results  Component Value Date   TSH 6.14 (H) 08/11/2020   Lab Results  Component Value Date   WBC 6.7 08/11/2020   HGB 13.7 08/11/2020   HCT 39.8 08/11/2020   MCV 84.0 08/11/2020   PLT 175.0 08/11/2020   Lab Results  Component Value Date   NA 138 08/11/2020   K 4.2 08/11/2020   CO2 26 08/11/2020   GLUCOSE 89 08/11/2020   BUN 12 08/11/2020   CREATININE 0.81 08/11/2020   BILITOT 0.3 08/11/2020   ALKPHOS 50 08/11/2020   AST 14 08/11/2020   ALT 19 08/11/2020   PROT 6.4 08/11/2020   ALBUMIN 4.1 08/11/2020   CALCIUM 9.2 08/11/2020   ANIONGAP 5 01/08/2019   GFR 88.01 08/11/2020   Lab Results  Component Value Date   CHOL 219 (H) 08/11/2020   Lab Results  Component Value Date    HDL 48.60 08/11/2020   Lab Results  Component Value Date   LDLCALC 135 (H) 08/11/2020   Lab Results  Component Value Date   TRIG 178.0 (H) 08/11/2020   Lab Results  Component Value Date   CHOLHDL 5 08/11/2020   Lab Results  Component Value Date   HGBA1C 5.7 05/18/2020      Assessment & Plan:   Problem List Items Addressed This Visit       Endocrine   Acquired hypothyroidism - Primary   Relevant Orders   T3, free (Completed)   T4, free (Completed)   TSH (Completed)   Ambulatory referral to Endocrinology   Other Visit Diagnoses     Healthcare maintenance       Relevant Orders   CBC (Completed)   Comprehensive metabolic panel (Completed)   Lipid panel (Completed)   Urinalysis, Routine w reflex microscopic (Completed)       No orders of the defined types were placed in this encounter.   Follow-up: Return in about 6 months (around 02/11/2021).  Given information on health maintenance and disease prevention.  Also given information on exercising to stay healthy.  We discussed giving a lower dose of Synthroid month or 2 more to work.  If her symptoms of hypothyroidism do not resolve we will consider endocrinology consultation.  Mliss Sax, MD

## 2020-08-12 ENCOUNTER — Encounter: Payer: Self-pay | Admitting: Family Medicine

## 2020-08-12 NOTE — Addendum Note (Signed)
Addended by: Nadene Rubins A on: 08/12/2020 01:06 PM   Modules accepted: Orders

## 2020-08-18 ENCOUNTER — Other Ambulatory Visit: Payer: Self-pay

## 2020-08-18 DIAGNOSIS — E039 Hypothyroidism, unspecified: Secondary | ICD-10-CM

## 2020-08-18 MED ORDER — SYNTHROID 150 MCG PO TABS: 150.0000 ug | ORAL_TABLET | Freq: Every morning | ORAL | 0 refills | Status: DC

## 2020-10-22 ENCOUNTER — Ambulatory Visit (INDEPENDENT_AMBULATORY_CARE_PROVIDER_SITE_OTHER): Payer: PRIVATE HEALTH INSURANCE | Admitting: Endocrinology

## 2020-10-22 ENCOUNTER — Other Ambulatory Visit: Payer: Self-pay

## 2020-10-22 VITALS — BP 124/60 | HR 92 | Ht 65.0 in | Wt 217.4 lb

## 2020-10-22 DIAGNOSIS — E039 Hypothyroidism, unspecified: Secondary | ICD-10-CM | POA: Diagnosis not present

## 2020-10-22 LAB — TSH: TSH: 1.36 u[IU]/mL (ref 0.35–5.50)

## 2020-10-22 LAB — T3, FREE: T3, Free: 3.4 pg/mL (ref 2.3–4.2)

## 2020-10-22 LAB — T4, FREE: Free T4: 1.13 ng/dL (ref 0.60–1.60)

## 2020-10-22 NOTE — Patient Instructions (Addendum)
Blood tests are requested for you today.  We'll let you know about the results.  If the blood tests are normal, you should conclude that the symptoms are for some other reason.   Let's check the ultrasound.  you will receive a phone call, about a day and time for an appointment. When you see your eye doctor, please mention the symptoms and your thyroid condition.

## 2020-10-22 NOTE — Progress Notes (Signed)
Subjective:    Patient ID: Alexis Alvarado, female    DOB: 1975-08-23, 45 y.o.   MRN: [id number]  HPI Pt is referred by Dr Doreene Burke, for hypothyroidism.  Pt reports hypothyroidism was dx'ed in 2011.  she has been on prescribed thyroid hormone therapy since dx.  she has never taken kelp or any other type of non-prescribed thyroid product.  she has never had thyroid imaging.  She is not considering a pregnancy.  she has never had thyroid surgery, or XRT to the neck.  He has never been on amiodarone or lithium.  She requests brand name synthroid.  She reports weight gain, mood swings, dry skin, hair loss, watery eyes, and fatigue.  Synthroid was reduced 4/22, and re-increased 7/22.   Past Medical History:  Diagnosis Date   Anxiety    Chronic back pain    Hypothyroidism    Thyroid disease    hypothyroid    Past Surgical History:  Procedure Laterality Date   LEG SURGERY     TUBAL LIGATION     tubual reversal      Social History   Socioeconomic History   Marital status: Single    Spouse name: Not on file   Number of children: Not on file   Years of education: Not on file   Highest education level: Not on file  Occupational History   Not on file  Tobacco Use   Smoking status: Former    Types: Cigarettes    Quit date: 09/15/2000    Years since quitting: 20.1   Smokeless tobacco: Never  Vaping Use   Vaping Use: Never used  Substance and Sexual Activity   Alcohol use: No    Comment: occassional before pregnancy   Drug use: No   Sexual activity: Yes    Birth control/protection: None, I.U.D.  Other Topics Concern   Not on file  Social History Narrative   Not on file   Social Determinants of Health   Financial Resource Strain: Not on file  Food Insecurity: Not on file  Transportation Needs: Not on file  Physical Activity: Not on file  Stress: Not on file  Social Connections: Not on file  Intimate Partner Violence: Not on file    Current Outpatient Medications on File  Prior to Visit  Medication Sig Dispense Refill   diclofenac Sodium (VOLTAREN) 1 % GEL Apply a grape sized dollop 3 times daily to right heel. 150 g 1   fluticasone (FLONASE) 50 MCG/ACT nasal spray Place 2 sprays into both nostrils daily. 16 g 6   levonorgestrel (MIRENA, 52 MG,) 20 MCG/24HR IUD Mirena 20 mcg/24 hours (6 yrs) 52 mg intrauterine device  Take 1 device by intrauterine route.     SYNTHROID 150 MCG tablet Take 1 tablet (150 mcg total) by mouth in the morning. 1 tablet po q am on fasting stomach 90 tablet 0   No current facility-administered medications on file prior to visit.    Allergies  Allergen Reactions   Dexamethasone Rash    Patient states she is not allergic.     Family History  Problem Relation Age of Onset   Hypertension Father    COPD Mother    Thyroid disease Maternal Grandmother     BP 124/60 (BP Location: Right Arm, Patient Position: Sitting, Cuff Size: Large)   Pulse 92   Ht 5\' 5"  (1.651 m)   Wt 217 lb 6.4 oz (98.6 kg)   SpO2 97%   BMI 36.18 kg/m  Review of Systems denies depression, constipation, numbness, and cold intolerance.      Objective:   Physical Exam VS: see vs page GEN: no distress HEAD: head: no deformity eyes: no periorbital swelling, no proptosis external nose and ears are normal NECK: supple, thyroid is slightly and diffusely enlarged CHEST WALL: no deformity LUNGS: clear to auscultation CV: reg rate and rhythm, no murmur.  MUSCULOSKELETAL: gait is normal and steady EXTEMITIES: no deformity.  no leg edema NEURO:  readily moves all 4's.  sensation is intact to touch on all 4's SKIN:  Normal texture and temperature.  No rash or suspicious lesion is visible.   NODES:  None palpable at the neck PSYCH: alert, well-oriented.  Does not appear anxious nor depressed.    Lab Results  Component Value Date   TSH 1.36 10/22/2020   I have reviewed outside records, and summarized: Pt was noted to have elevated TSH, and referred  here.  She reported sxs with each dosage adjustment of synthroid     Assessment & Plan:  Chronic primary hypothyroidism, well-controlled.  Please continue the same synthroid Eye irritation: usually due to allergic conjunctivitis, but Grave's eye dz is possible.  I advised pt to tell eye dr about thyroid dx at upcoming appt there.

## 2020-10-25 ENCOUNTER — Ambulatory Visit
Admission: RE | Admit: 2020-10-25 | Discharge: 2020-10-25 | Disposition: A | Discharge status: Home / Self Care | Payer: PRIVATE HEALTH INSURANCE | Source: Ambulatory Visit | Attending: Endocrinology | Admitting: Endocrinology

## 2020-10-25 DIAGNOSIS — E039 Hypothyroidism, unspecified: Secondary | ICD-10-CM

## 2020-11-02 ENCOUNTER — Other Ambulatory Visit: Payer: Self-pay | Admitting: Family Medicine

## 2020-11-02 DIAGNOSIS — E039 Hypothyroidism, unspecified: Secondary | ICD-10-CM

## 2020-11-11 ENCOUNTER — Other Ambulatory Visit: Payer: Self-pay | Admitting: Family Medicine

## 2020-11-11 DIAGNOSIS — E039 Hypothyroidism, unspecified: Secondary | ICD-10-CM

## 2020-11-11 MED ORDER — SYNTHROID 150 MCG PO TABS
150.0000 ug | ORAL_TABLET | Freq: Every morning | ORAL | 0 refills | Status: DC
Start: 2020-11-11 — End: 2021-02-24

## 2020-11-18 ENCOUNTER — Ambulatory Visit: Payer: PRIVATE HEALTH INSURANCE | Admitting: Family Medicine

## 2020-12-30 ENCOUNTER — Ambulatory Visit: Payer: PRIVATE HEALTH INSURANCE | Admitting: Family Medicine

## 2021-01-22 ENCOUNTER — Other Ambulatory Visit: Payer: Self-pay | Admitting: Family Medicine

## 2021-01-22 DIAGNOSIS — E039 Hypothyroidism, unspecified: Secondary | ICD-10-CM

## 2021-02-01 ENCOUNTER — Other Ambulatory Visit: Payer: Self-pay | Admitting: Family Medicine

## 2021-02-01 DIAGNOSIS — E039 Hypothyroidism, unspecified: Secondary | ICD-10-CM

## 2021-02-22 ENCOUNTER — Ambulatory Visit (INDEPENDENT_AMBULATORY_CARE_PROVIDER_SITE_OTHER): Payer: PRIVATE HEALTH INSURANCE | Admitting: Endocrinology

## 2021-02-22 ENCOUNTER — Encounter: Payer: Self-pay | Admitting: Endocrinology

## 2021-02-22 ENCOUNTER — Other Ambulatory Visit: Payer: Self-pay

## 2021-02-22 VITALS — BP 128/88 | HR 79 | Ht 65.0 in | Wt 215.2 lb

## 2021-02-22 DIAGNOSIS — E039 Hypothyroidism, unspecified: Secondary | ICD-10-CM | POA: Diagnosis not present

## 2021-02-22 NOTE — Progress Notes (Signed)
° °  Subjective:    Patient ID: Alexis Alvarado, female    DOB: [birth year], 46 y.o.   MRN: [id number]  HPI Pt returns for f/u of chronic primary hypothyroidism (dx'ed 2011; she is not at risk for pregnancy watery eyes, and fatigue; [place] (2022): heterogeneity, no nodule).  pt states she feels well in general.  She takes synthroid as rx'ed.  She is OK with generic.  Pt says opthal did not find Grave's Dz.   Past Medical History:  Diagnosis Date   Anxiety    Chronic back pain    Hypothyroidism    Thyroid disease    hypothyroid    Past Surgical History:  Procedure Laterality Date   LEG SURGERY     TUBAL LIGATION     tubual reversal      Social History   Socioeconomic History   Marital status: Single    Spouse name: Not on file   Number of children: Not on file   Years of education: Not on file   Highest education level: Not on file  Occupational History   Not on file  Tobacco Use   Smoking status: Former    Types: Cigarettes    Quit date: 09/15/2000    Years since quitting: 20.4   Smokeless tobacco: Never  Vaping Use   Vaping Use: Never used  Substance and Sexual Activity   Alcohol use: No    Comment: occassional before pregnancy   Drug use: No   Sexual activity: Yes    Birth control/protection: None, I.U.D.  Other Topics Concern   Not on file  Social History Narrative   Not on file   Social Determinants of Health   Financial Resource Strain: Not on file  Food Insecurity: Not on file  Transportation Needs: Not on file  Physical Activity: Not on file  Stress: Not on file  Social Connections: Not on file  Intimate Partner Violence: Not on file    Current Outpatient Medications on File Prior to Visit  Medication Sig Dispense Refill   diclofenac Sodium (VOLTAREN) 1 % GEL Apply a grape sized dollop 3 times daily to right heel. 150 g 1   fluticasone (FLONASE) 50 MCG/ACT nasal spray Place 2 sprays into both nostrils daily. 16 g 6   levonorgestrel (MIRENA, 52 MG,) 20  MCG/24HR IUD Mirena 20 mcg/24 hours (6 yrs) 52 mg intrauterine device  Take 1 device by intrauterine route.     No current facility-administered medications on file prior to visit.    Allergies  Allergen Reactions   Dexamethasone Rash    Patient states she is not allergic.     Family History  Problem Relation Age of Onset   Hypertension Father    COPD Mother    Thyroid disease Maternal Grandmother     BP 128/88 (BP Location: Left Arm, Patient Position: Sitting, Cuff Size: Normal)    Pulse 79    Ht 5\' 5"  (1.651 m)    Wt 215 lb 3.2 oz (97.6 kg)    SpO2 98%    BMI 35.81 kg/m    Review of Systems     Objective:   Physical Exam VITAL SIGNS:  See vs page GENERAL: no distress NECK: thyroid is slightly and diffusely enlarged.     Lab Results  Component Value Date   TSH 0.97 02/24/2021      Assessment & Plan:  Hypothyroidism: well-controlled.  Please continue the same synthroid

## 2021-02-22 NOTE — Patient Instructions (Addendum)
Blood tests are requested for you today.  We'll let you know about the results.  Please come back for a follow-up appointment in 6 months.   

## 2021-02-24 ENCOUNTER — Other Ambulatory Visit: Payer: Self-pay

## 2021-02-24 ENCOUNTER — Other Ambulatory Visit (INDEPENDENT_AMBULATORY_CARE_PROVIDER_SITE_OTHER): Payer: PRIVATE HEALTH INSURANCE

## 2021-02-24 DIAGNOSIS — E039 Hypothyroidism, unspecified: Secondary | ICD-10-CM

## 2021-02-24 LAB — T3, FREE: T3, Free: 4 pg/mL (ref 2.3–4.2)

## 2021-02-24 LAB — T4, FREE: Free T4: 1.34 ng/dL (ref 0.60–1.60)

## 2021-02-24 LAB — TSH: TSH: 0.97 u[IU]/mL (ref 0.35–5.50)

## 2021-02-24 MED ORDER — SYNTHROID 150 MCG PO TABS: 150.0000 ug | ORAL_TABLET | Freq: Every day | ORAL | 3 refills | Status: DC

## 2021-02-25 ENCOUNTER — Telehealth: Payer: Self-pay

## 2021-02-25 MED ORDER — LEVOTHYROXINE SODIUM 150 MCG PO TABS: 150.0000 ug | ORAL_TABLET | Freq: Every day | ORAL | 3 refills | Status: DC

## 2021-02-25 NOTE — Telephone Encounter (Signed)
Spoke with pt and she stated that she thought you were going to send in the generic levothyroxine instead of synthroid. She is requesting the generic.

## 2021-04-14 ENCOUNTER — Encounter: Payer: Self-pay | Admitting: Internal Medicine

## 2021-05-05 ENCOUNTER — Other Ambulatory Visit: Payer: Self-pay

## 2021-05-05 ENCOUNTER — Ambulatory Visit (AMBULATORY_SURGERY_CENTER): Payer: PRIVATE HEALTH INSURANCE | Admitting: *Deleted

## 2021-05-05 VITALS — Ht 65.0 in | Wt 210.0 lb

## 2021-05-05 DIAGNOSIS — Z1211 Encounter for screening for malignant neoplasm of colon: Secondary | ICD-10-CM

## 2021-05-05 MED ORDER — NA SULFATE-K SULFATE-MG SULF 17.5-3.13-1.6 GM/177ML PO SOLN: 1.0000 | Freq: Once | ORAL | 0 refills | Status: AC

## 2021-05-05 NOTE — Progress Notes (Signed)
Virtual pre visit completed over telephone. ?Instructions sent through MyChart. ? ? ?No egg or soy allergy known to patient  ?No issues known to pt with past sedation with any surgeries or procedures ?Patient denies ever being told they had issues or difficulty with intubation  ?No FH of Malignant Hyperthermia ?Pt is not on diet pills ?Pt is not on  home 02  ?Pt is not on blood thinners  ?Pt denies issues with constipation  ?No A fib or A flutter ? ?Discussed with pt there will be an out-of-pocket cost for prep and that varies from $0 to 70 +  dollars - pt verbalized understanding  ? ?Due to the COVID-19 pandemic we are asking patients to follow certain guidelines in PV and the [facility]   ?Pt aware of COVID protocols and [facility] guidelines  ? ?PV completed over the phone. Pt verified name, DOB, address and insurance during PV today.  ?Pt mailed instruction packet with copy of consent form to read and not return, and instructions.  ?Pt encouraged to call with questions or issues.  ?If pt has My chart, procedure instructions sent via My Chart   ?

## 2021-05-18 ENCOUNTER — Encounter: Payer: Self-pay | Admitting: Certified Registered Nurse Anesthetist

## 2021-05-18 ENCOUNTER — Encounter: Payer: Self-pay | Admitting: Internal Medicine

## 2021-05-26 ENCOUNTER — Ambulatory Visit (AMBULATORY_SURGERY_CENTER): Payer: PRIVATE HEALTH INSURANCE | Admitting: Internal Medicine

## 2021-05-26 ENCOUNTER — Encounter: Payer: Self-pay | Admitting: Internal Medicine

## 2021-05-26 VITALS — BP 159/73 | HR 63 | Temp 98.0°F | Resp 17 | Ht 65.0 in | Wt 210.0 lb

## 2021-05-26 DIAGNOSIS — Z1211 Encounter for screening for malignant neoplasm of colon: Secondary | ICD-10-CM

## 2021-05-26 MED ORDER — SODIUM CHLORIDE 0.9 % IV SOLN: 500.0000 mL | Freq: Once | INTRAVENOUS | Status: DC

## 2021-05-26 NOTE — Progress Notes (Signed)
? ?GASTROENTEROLOGY PROCEDURE H&P NOTE  ? ?Primary Care Physician: ?Mliss Sax, MD ? ? ? ?Reason for Procedure:   Colon cancer screening ? ?Plan:    Colonoscopy ? ?Patient is appropriate for endoscopic procedure(s) in the ambulatory ([facility]) setting. ? ?The nature of the procedure, as well as the risks, benefits, and alternatives were carefully and thoroughly reviewed with the patient. Ample time for discussion and questions allowed. The patient understood, was satisfied, and agreed to proceed.  ? ? ? ?HPI: ?Alexis Alvarado is a 45 y.o. female who presents for colonoscopy for colon cancer screening. Denies blood in stools, changes in bowel habits, weight loss. Denies fam hx of colon cancer. ? ?Past Medical History:  ?Diagnosis Date  ? Anxiety   ? Chronic back pain   ? Hypothyroidism   ? Thyroid disease   ? hypothyroid  ? ? ?Past Surgical History:  ?Procedure Laterality Date  ? LEG SURGERY    ? TUBAL LIGATION    ? tubual reversal    ? ? ?Prior to Admission medications   ?Medication Sig Start Date End Date Taking? Authorizing Provider  ?fluticasone (FLONASE) 50 MCG/ACT nasal spray Place 2 sprays into both nostrils daily. 03/25/20  Yes Terressa Koyanagi, DO  ?levothyroxine (SYNTHROID) 150 MCG tablet Take 1 tablet (150 mcg total) by mouth daily. 02/25/21  Yes Romero Belling, MD  ?diclofenac Sodium (VOLTAREN) 1 % GEL Apply a grape sized dollop 3 times daily to right heel. ?Patient not taking: Reported on 05/05/2021 05/26/19   Mliss Sax, MD  ?levonorgestrel (MIRENA, 52 MG,) 20 MCG/24HR IUD Mirena 20 mcg/24 hours (6 yrs) 52 mg intrauterine device ? Take 1 device by intrauterine route.    [provider]  ?valACYclovir (VALTREX) 1000 MG tablet valacyclovir 1 gram tablet ?Patient not taking: Reported on 05/05/2021    [provider]  ? ? ?Current Outpatient Medications  ?Medication Sig Dispense Refill  ? fluticasone (FLONASE) 50 MCG/ACT nasal spray Place 2 sprays into both nostrils daily. 16 g 6   ? levothyroxine (SYNTHROID) 150 MCG tablet Take 1 tablet (150 mcg total) by mouth daily. 90 tablet 3  ? diclofenac Sodium (VOLTAREN) 1 % GEL Apply a grape sized dollop 3 times daily to right heel. (Patient not taking: Reported on 05/05/2021) 150 g 1  ? levonorgestrel (MIRENA, 52 MG,) 20 MCG/24HR IUD Mirena 20 mcg/24 hours (6 yrs) 52 mg intrauterine device ? Take 1 device by intrauterine route.    ? valACYclovir (VALTREX) 1000 MG tablet valacyclovir 1 gram tablet (Patient not taking: Reported on 05/05/2021)    ? ?Current Facility-Administered Medications  ?Medication Dose Route Frequency Provider Last Rate Last Admin  ? 0.9 %  sodium chloride infusion  500 mL Intravenous Once Imogene Burn, MD      ? ? ?Allergies as of 05/26/2021 - Review Complete 05/26/2021  ?Allergen Reaction Noted  ? Dexamethasone Rash 07/21/2016  ? ? ?Family History  ?Problem Relation Age of Onset  ? COPD Mother   ? Colon polyps Father   ? Hypertension Father   ? Thyroid disease Maternal Grandmother   ? Colon cancer Neg Hx   ? Esophageal cancer Neg Hx   ? Rectal cancer Neg Hx   ? Stomach cancer Neg Hx   ? ? ?Social History  ? ?Socioeconomic History  ? Marital status: Single  ?  Spouse name: Not on file  ? Number of children: Not on file  ? Years of education: Not on file  ?  Highest education level: Not on file  ?Occupational History  ? Not on file  ?Tobacco Use  ? Smoking status: Former  ?  Types: Cigarettes  ?  Quit date: 09/15/2000  ?  Years since quitting: 20.7  ? Smokeless tobacco: Never  ?Vaping Use  ? Vaping Use: Never used  ?Substance and Sexual Activity  ? Alcohol use: No  ?  Comment: occassional before pregnancy  ? Drug use: No  ? Sexual activity: Yes  ?  Birth control/protection: None, I.U.D.  ?Other Topics Concern  ? Not on file  ?Social History Narrative  ? Not on file  ? ?Social Determinants of Health  ? ?Financial Resource Strain: Not on file  ?Food Insecurity: Not on file  ?Transportation Needs: Not on file  ?Physical Activity: Not  on file  ?Stress: Not on file  ?Social Connections: Not on file  ?Intimate Partner Violence: Not on file  ? ? ?Physical Exam: ?Vital signs in last 24 hours: ?BP 132/71   Pulse 74   Temp 98 ?F (36.7 ?C) (Temporal)   Ht 5\' 5"  (1.651 m)   Wt 210 lb (95.3 kg)   SpO2 98%   BMI 34.95 kg/m?  ?GEN: NAD ?EYE: Sclerae anicteric ?ENT: MMM ?CV: Non-tachycardic ?Pulm: No increased work of breathing ?GI: Soft, NT/ND ?NEURO:  Alert & Oriented ? ? ? , MD ?[facility] ? ?05/26/2021 10:19 AM ? ?

## 2021-05-26 NOTE — Op Note (Addendum)
[place] ?Patient Name: Alexis Alvarado ?Procedure Date: 05/26/2021 10:53 AM ?MRN: [id number] ?Endoscopist: Sonny Masters "Alexis Alvarado ,  ?Age: 46 ?Referring MD:  ?Date of Birth: [birth year] ?Gender: Female ?Account #: [id number] ?Procedure:                Colonoscopy ?Indications:              Screening for colorectal malignant neoplasm, This  ?                          is the patient's first colonoscopy ?Medicines:                Monitored Anesthesia Care ?Procedure:                Pre-Anesthesia Assessment: ?                          - Prior to the procedure, a History and Physical  ?                          was performed, and patient medications and  ?                          allergies were reviewed. The patient's tolerance of  ?                          previous anesthesia was also reviewed. The risks  ?                          and benefits of the procedure and the sedation  ?                          options and risks were discussed with the patient.  ?                          All questions were answered, and informed consent  ?                          was obtained. Prior Anticoagulants: The patient has  ?                          taken no previous anticoagulant or antiplatelet  ?                          agents. ASA Grade Assessment: II - A patient with  ?                          mild systemic disease. After reviewing the risks  ?                          and benefits, the patient was deemed in  ?                          satisfactory condition to undergo the procedure. ?  After obtaining informed consent, the colonoscope  ?                          was passed under direct vision. Throughout the  ?                          procedure, the patient's blood pressure, pulse, and  ?                          oxygen saturations were monitored continuously. The  ?                          Olympus [id number]) Colonoscope was  ?                          introduced through the  anus and advanced to the the  ?                          terminal ileum. The colonoscopy was performed  ?                          without difficulty. The patient tolerated the  ?                          procedure well. The quality of the bowel  ?                          preparation was excellent. The terminal ileum,  ?                          ileocecal valve, appendiceal orifice, and rectum  ?                          were photographed. ?Scope In: [id number] AM ?Scope Out: 11:17:55 AM ?Scope Withdrawal Time: 0 hours 8 minutes 48 seconds  ?Total Procedure Duration: 0 hours 10 minutes 56 seconds  ?Findings:                 The terminal ileum appeared normal. ?                          A few small-mouthed diverticula were found in the  ?                          sigmoid colon. ?                          Non-bleeding internal hemorrhoids were found during  ?                          retroflexion. ?Complications:            No immediate complications. ?Estimated Blood Loss:     Estimated blood loss: none. ?Impression:               - The examined portion of the ileum was normal. ?                          -  Diverticulosis in the sigmoid colon. ?                          - Non-bleeding internal hemorrhoids. ?                          - No specimens collected. ?Recommendation:           - Discharge patient to home (with escort). ?                          - Repeat colonoscopy in 10 years for screening  ?                          purposes. ?                          - The findings and recommendations were discussed  ?                          with the patient. ?Georgian Co,  ?05/26/2021 11:20:04 AM ?

## 2021-05-26 NOTE — Patient Instructions (Signed)
Repeat colonoscopy in 10 years for screening purposes.  ° °YOU HAD AN ENDOSCOPIC PROCEDURE TODAY AT THE [facility]:   Refer to the procedure report that was given to you for any specific questions about what was found during the examination.  If the procedure report does not answer your questions, please call your gastroenterologist to clarify.  If you requested that your care partner not be given the details of your procedure findings, then the procedure report has been included in a sealed envelope for you to review at your convenience later. ° °YOU SHOULD EXPECT: Some feelings of bloating in the abdomen. Passage of more gas than usual.  Walking can help get rid of the air that was put into your GI tract during the procedure and reduce the bloating. If you had a lower endoscopy (such as a colonoscopy or flexible sigmoidoscopy) you may notice spotting of blood in your stool or on the toilet paper. If you underwent a bowel prep for your procedure, you may not have a normal bowel movement for a few days. ° °Please Note:  You might notice some irritation and congestion in your nose or some drainage.  This is from the oxygen used during your procedure.  There is no need for concern and it should clear up in a day or so. ° °SYMPTOMS TO REPORT IMMEDIATELY: ° °Following lower endoscopy (colonoscopy or flexible sigmoidoscopy): ° Excessive amounts of blood in the stool ° Significant tenderness or worsening of abdominal pains ° Swelling of the abdomen that is new, acute ° Fever of 100°F or higher ° °For urgent or emergent issues, a gastroenterologist can be reached at any hour by calling [phone]. °Do not use MyChart messaging for urgent concerns.  ° ° °DIET:  We do recommend a small meal at first, but then you may proceed to your regular diet.  Drink plenty of fluids but you should avoid alcoholic beverages for 24 hours. ° °ACTIVITY:  You should plan to take it easy for the rest of today and you should  NOT DRIVE or use heavy machinery until tomorrow (because of the sedation medicines used during the test).   ° °FOLLOW UP: °Our staff will call the number listed on your records 48-72 hours following your procedure to check on you and address any questions or concerns that you may have regarding the information given to you following your procedure. If we do not reach you, we will leave a message.  We will attempt to reach you two times.  During this call, we will ask if you have developed any symptoms of COVID 19. If you develop any symptoms (ie: fever, flu-like symptoms, shortness of breath, cough etc.) before then, please call [phone].  If you test positive for Covid 19 in the 2 weeks post procedure, please call and report this information to us.   ° °If any biopsies were taken you will be contacted by phone or by letter within the next 1-3 weeks.  Please call us at [phone] if you have not heard about the biopsies in 3 weeks.  ° ° °SIGNATURES/CONFIDENTIALITY: °You and/or your care partner have signed paperwork which will be entered into your electronic medical record.  These signatures attest to the fact that that the information above on your After Visit Summary has been reviewed and is understood.  Full responsibility of the confidentiality of this discharge information lies with you and/or your care-partner. ° °

## 2021-05-26 NOTE — Progress Notes (Signed)
I have reviewed the patient's medical history in detail and updated the computerized patient record.   VS BY DT. 

## 2021-05-30 ENCOUNTER — Telehealth: Payer: Self-pay

## 2021-05-30 NOTE — Telephone Encounter (Signed)
?  Follow up Call- ? ? ?  05/26/2021  ? 10:05 AM  ?Call back number  ?Post procedure Call Back phone  # [phone]  ?Permission to leave phone message Yes  ?  ? ?Post op call attempted, no answer, left WM.  ? ? ?

## 2021-05-30 NOTE — Telephone Encounter (Signed)
No answer on second attempt follow up call.  ? ?

## 2021-07-29 ENCOUNTER — Telehealth: Payer: Self-pay

## 2021-07-29 NOTE — Telephone Encounter (Signed)
LMTCB to schedule with new provider.  

## 2021-11-10 ENCOUNTER — Encounter: Payer: Self-pay | Admitting: Internal Medicine

## 2021-11-10 ENCOUNTER — Ambulatory Visit: Payer: PRIVATE HEALTH INSURANCE | Admitting: Internal Medicine

## 2021-11-10 VITALS — BP 124/80 | HR 71 | Ht 65.0 in | Wt 214.0 lb

## 2021-11-10 DIAGNOSIS — E039 Hypothyroidism, unspecified: Secondary | ICD-10-CM | POA: Diagnosis not present

## 2021-11-10 LAB — T4, FREE: Free T4: 1.17 ng/dL (ref 0.60–1.60)

## 2021-11-10 LAB — TSH: TSH: 0.74 u[IU]/mL (ref 0.35–5.50)

## 2021-11-10 MED ORDER — LEVOTHYROXINE SODIUM 150 MCG PO TABS: 150.0000 ug | ORAL_TABLET | Freq: Every day | ORAL | 3 refills | Status: DC

## 2021-11-10 NOTE — Progress Notes (Signed)
Name: Alexis Alvarado  MRN/ DOB: [id number], Jan 16, 1976    Age/ Sex: 46 y.o., female     PCP: Libby Maw, MD   Reason for Endocrinology Evaluation: Hypothyroidism     Initial Endocrinology Clinic Visit: 10/22/2020    PATIENT IDENTIFIER: Alexis Alvarado is a 46 y.o., female with a past medical history of hypothyroidism. She has followed with [facility] since 10/22/2020  for consultative assistance with management of her hypothyroidism.   HISTORICAL SUMMARY: The patient was first diagnosed with hypothyroidism in 2011 , she has been on LT-4 replacement therapy since her diagnosis.   No XRT   Maternal GM with thyroid disease   SUBJECTIVE:    Today (11/10/2021):  Alexis Alvarado is here for a follow up on hypothyroidism.   Denies local neck swelling  Has occasional constipation  Has occasional palpitations with anxiety  Does not sleep well at night  Denies tremors  She has Mirena   Levothyroxine 150 mcg daily    HISTORY:  Past Medical History:  Past Medical History:  Diagnosis Date   Anxiety    Chronic back pain    Hypothyroidism    Thyroid disease    hypothyroid   Past Surgical History:  Past Surgical History:  Procedure Laterality Date   LEG SURGERY     TUBAL LIGATION     tubual reversal     Social History:  reports that she quit smoking about 21 years ago. Her smoking use included cigarettes. She has never used smokeless tobacco. She reports that she does not drink alcohol and does not use drugs. Family History:  Family History  Problem Relation Age of Onset   COPD Mother    Colon polyps Father    Hypertension Father    Thyroid disease Maternal Grandmother    Colon cancer Neg Hx    Esophageal cancer Neg Hx    Rectal cancer Neg Hx    Stomach cancer Neg Hx      HOME MEDICATIONS: Allergies as of 11/10/2021       Reactions   Dexamethasone Rash   Patient states she is not allergic.         Medication List         Accurate as of November 10, 2021  8:51 AM. If you have any questions, ask your nurse or doctor.          STOP taking these medications    diclofenac Sodium 1 % Gel Commonly known as: Voltaren Stopped by: Dorita Sciara, MD       TAKE these medications    fluticasone 50 MCG/ACT nasal spray Commonly known as: FLONASE Place 2 sprays into both nostrils daily.   levothyroxine 150 MCG tablet Commonly known as: SYNTHROID Take 1 tablet (150 mcg total) by mouth daily.   Mirena (52 MG) 20 MCG/DAY Iud Generic drug: levonorgestrel Mirena 20 mcg/24 hours (6 yrs) 52 mg intrauterine device  Take 1 device by intrauterine route.   valACYclovir 1000 MG tablet Commonly known as: VALTREX valacyclovir 1 gram tablet          OBJECTIVE:   PHYSICAL EXAM: VS: BP 124/80 (BP Location: Left Arm, Patient Position: Sitting, Cuff Size: Large)   Pulse 71   Ht 5\' 5"  (1.651 m)   Wt 214 lb (97.1 kg)   SpO2 98%   BMI 35.61 kg/m    EXAM: General: Pt appears well and is in NAD  Eyes: External eye exam normal without stare, lid lag  or exophthalmos.  EOM intact.    Neck: General: Supple without adenopathy. Thyroid: Thyroid size normal.  No goiter or nodules appreciated. No thyroid bruit.  Lungs: Clear with good BS bilat with no rales, rhonchi, or wheezes  Heart: Auscultation: RRR.  Abdomen: Normoactive bowel sounds, soft, nontender, without masses or organomegaly palpable  Extremities:  BL LE: No pretibial edema normal ROM and strength.  Mental Status: Judgment, insight: Intact Orientation: Oriented to time, place, and person Mood and affect: No depression, anxiety, or agitation     DATA REVIEWED:   Latest Reference Range & Units 11/10/21 09:04  TSH 0.35 - 5.50 uIU/mL 0.74  T4,Free(Direct) 0.60 - 1.60 ng/dL 3.73     ASSESSMENT / PLAN / RECOMMENDATIONS:   Hypothyroidism:   -Patient is clinically euthyroid -No local neck symptoms - Pt educated extensively on the correct  way to take levothyroxine (first thing in the morning with water, 30 minutes before eating or taking other medications). - Pt encouraged to double dose the following day if she were to miss a dose given long half-life of levothyroxine. -TSH normal, no change   Medications   Continue levothyroxine 150 mcg daily   Follow-up in 6 months  Signed electronically by: Lyndle Herrlich, MD  [facility]  [facility] [address], [address] [place], [place] [address] Phone: [phone] FAX: [phone]      CC: Mliss Sax, MD [address] [place] [address] Phone: [phone]  Fax: [phone]   Return to Endocrinology clinic as below: Future Appointments  Date Time Provider Department Center  11/17/2021  2:00 PM Nadara Mustard, MD [facility] None

## 2021-11-10 NOTE — Patient Instructions (Signed)

## 2021-11-17 ENCOUNTER — Ambulatory Visit (INDEPENDENT_AMBULATORY_CARE_PROVIDER_SITE_OTHER): Payer: PRIVATE HEALTH INSURANCE

## 2021-11-17 ENCOUNTER — Ambulatory Visit (INDEPENDENT_AMBULATORY_CARE_PROVIDER_SITE_OTHER): Payer: PRIVATE HEALTH INSURANCE | Admitting: Orthopedic Surgery

## 2021-11-17 DIAGNOSIS — M25561 Pain in right knee: Secondary | ICD-10-CM

## 2021-11-17 DIAGNOSIS — G8929 Other chronic pain: Secondary | ICD-10-CM

## 2021-11-17 DIAGNOSIS — M5441 Lumbago with sciatica, right side: Secondary | ICD-10-CM

## 2021-11-17 MED ORDER — PREDNISONE 10 MG PO TABS: 10.0000 mg | ORAL_TABLET | Freq: Every day | ORAL | 0 refills | Status: DC

## 2021-11-18 ENCOUNTER — Encounter: Payer: Self-pay | Admitting: Orthopedic Surgery

## 2021-11-18 NOTE — Progress Notes (Signed)
Office Visit Note   Patient: Alexis Alvarado           Date of Birth: Oct 08, 1975           MRN: [id number] Visit Date: 11/17/2021              Requested by: Libby Maw, MD [address] [place],  [place] [address] PCP: Libby Maw, MD  Chief Complaint  Patient presents with   Lower Back - Pain   Right Knee - Pain      HPI: Patient is a 46 year old woman is seen for initial evaluation for lower back pain and right knee pain.  Patient states she has had an MRI scan years ago which showed arthritis and disc issues.  Patient states she underwent [facility] of the nerve [facility].  She denies any radicular symptoms.  She also states she has right-sided medial knee pain with weakness with standing.  Denies any swelling she states she does have decreased range of motion with start up stiffness.  Assessment & Plan: Visit Diagnoses:  1. Chronic midline low back pain with right-sided sciatica   2. Chronic pain of right knee     Plan: Patient is provided a prescription for prednisone 10 mg with breakfast.  Will reevaluate in 4 weeks.  Patient may require repeat MRI scan.  Follow-Up Instructions: Return in about 4 weeks (around 12/15/2021).   Ortho Exam  Patient is alert, oriented, no adenopathy, well-dressed, normal affect, normal respiratory effort. Examination patient has a negative straight leg raise bilaterally no focal motor weakness.  Examination the right knee there is pain to palpation along the patellofemoral joint.  There is crepitation with range of motion collaterals and cruciates are stable there is no effusion.  Imaging: XR Lumbar Spine 2-3 Views  Result Date: 11/18/2021 2 view radiographs of the lumbar spine shows anterior periarticular bony spurs with facet arthritis.  XR Knee 1-2 Views Right  Result Date: 11/18/2021 2 view radiographs of the right knee shows a congruent joint without subchondral or cysts or  periarticular spurs.  No images are attached to the encounter.  Labs: Lab Results  Component Value Date   HGBA1C 5.7 05/18/2020   HGBA1C 5.1 05/28/2019     Lab Results  Component Value Date   ALBUMIN 4.1 08/11/2020   ALBUMIN 4.4 05/28/2019   ALBUMIN 3.8 01/08/2019    No results found for: "MG" Lab Results  Component Value Date   VD25OH 53.91 05/28/2019    No results found for: "PREALBUMIN"    Latest Ref Rng & Units 08/11/2020   10:03 AM 05/28/2019    8:02 AM 01/08/2019    7:41 PM  CBC EXTENDED  WBC 4.0 - 10.5 K/uL 6.7  6.0  7.1   RBC 3.87 - 5.11 Mil/uL 4.74  4.53  4.65   Hemoglobin 12.0 - 15.0 g/dL 13.7  14.4  13.4   HCT 36.0 - 46.0 % 39.8  41.7  39.8   Platelets 150.0 - 400.0 K/uL 175.0  233.0  187   NEUT# 1.7 - 7.7 K/uL   3.9   Lymph# 0.7 - 4.0 K/uL   2.5      There is no height or weight on file to calculate BMI.  Orders:  Orders Placed This Encounter  Procedures   XR Lumbar Spine 2-3 Views   XR Knee 1-2 Views Right   Meds ordered this encounter  Medications   predniSONE (DELTASONE) 10 MG tablet  Sig: Take 1 tablet (10 mg total) by mouth daily with breakfast.    Dispense:  30 tablet    Refill:  0     Procedures: No procedures performed  Clinical Data: No additional findings.  ROS:  All other systems negative, except as noted in the HPI. Review of Systems  Objective: Vital Signs: There were no vitals taken for this visit.  Specialty Comments:  No specialty comments available.  PMFS History: Patient Active Problem List   Diagnosis Date Noted   History of gestational diabetes 05/18/2020   Plantar fasciitis of right foot 05/26/2019   Spontaneous vaginal delivery 11/22/2013   Labor and delivery, indication for care 11/21/2013   Acquired hypothyroidism 04/11/2013   Past Medical History:  Diagnosis Date   Anxiety    Chronic back pain    Hypothyroidism    Thyroid disease    hypothyroid    Family History  Problem Relation Age of  Onset   COPD Mother    Colon polyps Father    Hypertension Father    Thyroid disease Maternal Grandmother    Colon cancer Neg Hx    Esophageal cancer Neg Hx    Rectal cancer Neg Hx    Stomach cancer Neg Hx     Past Surgical History:  Procedure Laterality Date   LEG SURGERY     TUBAL LIGATION     tubual reversal     Social History   Occupational History   Not on file  Tobacco Use   Smoking status: Former    Types: Cigarettes    Quit date: 09/15/2000    Years since quitting: 21.1   Smokeless tobacco: Never  Vaping Use   Vaping Use: Never used  Substance and Sexual Activity   Alcohol use: No    Comment: occassional before pregnancy   Drug use: No   Sexual activity: Yes    Birth control/protection: None, I.U.D.

## 2021-12-20 ENCOUNTER — Ambulatory Visit: Payer: PRIVATE HEALTH INSURANCE | Admitting: Orthopedic Surgery

## 2022-01-24 ENCOUNTER — Ambulatory Visit (INDEPENDENT_AMBULATORY_CARE_PROVIDER_SITE_OTHER): Payer: PRIVATE HEALTH INSURANCE

## 2022-01-24 ENCOUNTER — Ambulatory Visit (INDEPENDENT_AMBULATORY_CARE_PROVIDER_SITE_OTHER): Payer: PRIVATE HEALTH INSURANCE | Admitting: Orthopedic Surgery

## 2022-01-24 DIAGNOSIS — M545 Low back pain, unspecified: Secondary | ICD-10-CM | POA: Diagnosis not present

## 2022-01-24 DIAGNOSIS — G8929 Other chronic pain: Secondary | ICD-10-CM

## 2022-01-24 DIAGNOSIS — M25531 Pain in right wrist: Secondary | ICD-10-CM

## 2022-01-24 DIAGNOSIS — M25561 Pain in right knee: Secondary | ICD-10-CM | POA: Diagnosis not present

## 2022-01-25 ENCOUNTER — Encounter: Payer: Self-pay | Admitting: Orthopedic Surgery

## 2022-01-25 NOTE — Progress Notes (Signed)
Office Visit Note   Patient: Alexis Alvarado           Date of Birth: 11/09/1975           MRN: [id number] Visit Date: 01/24/2022              Requested by: Libby Maw, [address] [place],  [place] [address] PCP: Libby Maw, MD  Chief Complaint  Patient presents with   Lower Back - Follow-up      HPI: Patient is a 47 year old woman who states that her radicular symptoms from her lower back have resolved she states she still has some right-sided lower back pain.  She states her knee pain is better.  Patient states she started developing some circumferential right wrist and hand pain.  Assessment & Plan: Visit Diagnoses:  1. Pain in right wrist   2. Chronic midline low back pain without sciatica   3. Chronic pain of right knee     Plan: No restrictions with her wrist back or knee.  Follow-Up Instructions: Return if symptoms worsen or fail to improve.   Ortho Exam  Patient is alert, oriented, no adenopathy, well-dressed, normal affect, normal respiratory effort. Examination of the right wrist she has full supination pronation.  The medial radial ulnar nerve function is intact no sensory deficits no weakness.  The first dorsal extensor compartment is nontender to palpation scapholunate and TFCC are nontender to palpation.  The carpal tunnel is nontender to palpation.  She has a negative straight leg raise motor strength is symmetric in both lower extremities.  Imaging: No results found. No images are attached to the encounter.  Labs: Lab Results  Component Value Date   HGBA1C 5.7 05/18/2020   HGBA1C 5.1 05/28/2019     Lab Results  Component Value Date   ALBUMIN 4.1 08/11/2020   ALBUMIN 4.4 05/28/2019   ALBUMIN 3.8 01/08/2019    No results found for: "MG" Lab Results  Component Value Date   VD25OH 53.91 05/28/2019    No results found for: "PREALBUMIN"    Latest Ref Rng & Units 08/11/2020   10:03 AM 05/28/2019    8:02 AM  01/08/2019    7:41 PM  CBC EXTENDED  WBC 4.0 - 10.5 K/uL 6.7  6.0  7.1   RBC 3.87 - 5.11 Mil/uL 4.74  4.53  4.65   Hemoglobin 12.0 - 15.0 g/dL 13.7  14.4  13.4   HCT 36.0 - 46.0 % 39.8  41.7  39.8   Platelets 150.0 - 400.0 K/uL 175.0  233.0  187   NEUT# 1.7 - 7.7 K/uL   3.9   Lymph# 0.7 - 4.0 K/uL   2.5      There is no height or weight on file to calculate BMI.  Orders:  Orders Placed This Encounter  Procedures   XR Wrist 2 Views Right   Ambulatory referral to Physical Therapy   No orders of the defined types were placed in this encounter.    Procedures: No procedures performed  Clinical Data: No additional findings.  ROS:  All other systems negative, except as noted in the HPI. Review of Systems  Objective: Vital Signs: There were no vitals taken for this visit.  Specialty Comments:  No specialty comments available.  PMFS History: Patient Active Problem List   Diagnosis Date Noted   History of gestational diabetes 05/18/2020   Plantar fasciitis of right foot 05/26/2019   Spontaneous vaginal delivery 11/22/2013   Labor  and delivery, indication for care 11/21/2013   Acquired hypothyroidism 04/11/2013   Past Medical History:  Diagnosis Date   Anxiety    Chronic back pain    Hypothyroidism    Thyroid disease    hypothyroid    Family History  Problem Relation Age of Onset   COPD Mother    Colon polyps Father    Hypertension Father    Thyroid disease Maternal Grandmother    Colon cancer Neg Hx    Esophageal cancer Neg Hx    Rectal cancer Neg Hx    Stomach cancer Neg Hx     Past Surgical History:  Procedure Laterality Date   LEG SURGERY     TUBAL LIGATION     tubual reversal     Social History   Occupational History   Not on file  Tobacco Use   Smoking status: Former    Types: Cigarettes    Quit date: 09/15/2000    Years since quitting: 21.3   Smokeless tobacco: Never  Vaping Use   Vaping Use: Never used  Substance and Sexual Activity    Alcohol use: No    Comment: occassional before pregnancy   Drug use: No   Sexual activity: Yes    Birth control/protection: None, I.U.D.

## 2022-05-04 IMAGING — DX DG FOOT COMPLETE 3+V*R*
3 series · 3 of 3 positions shown · non-contrast
Comparison: None.

CLINICAL DATA: Plantar fasciitis

EXAM:
RIGHT FOOT COMPLETE - 3+ VIEW

[foot ap]
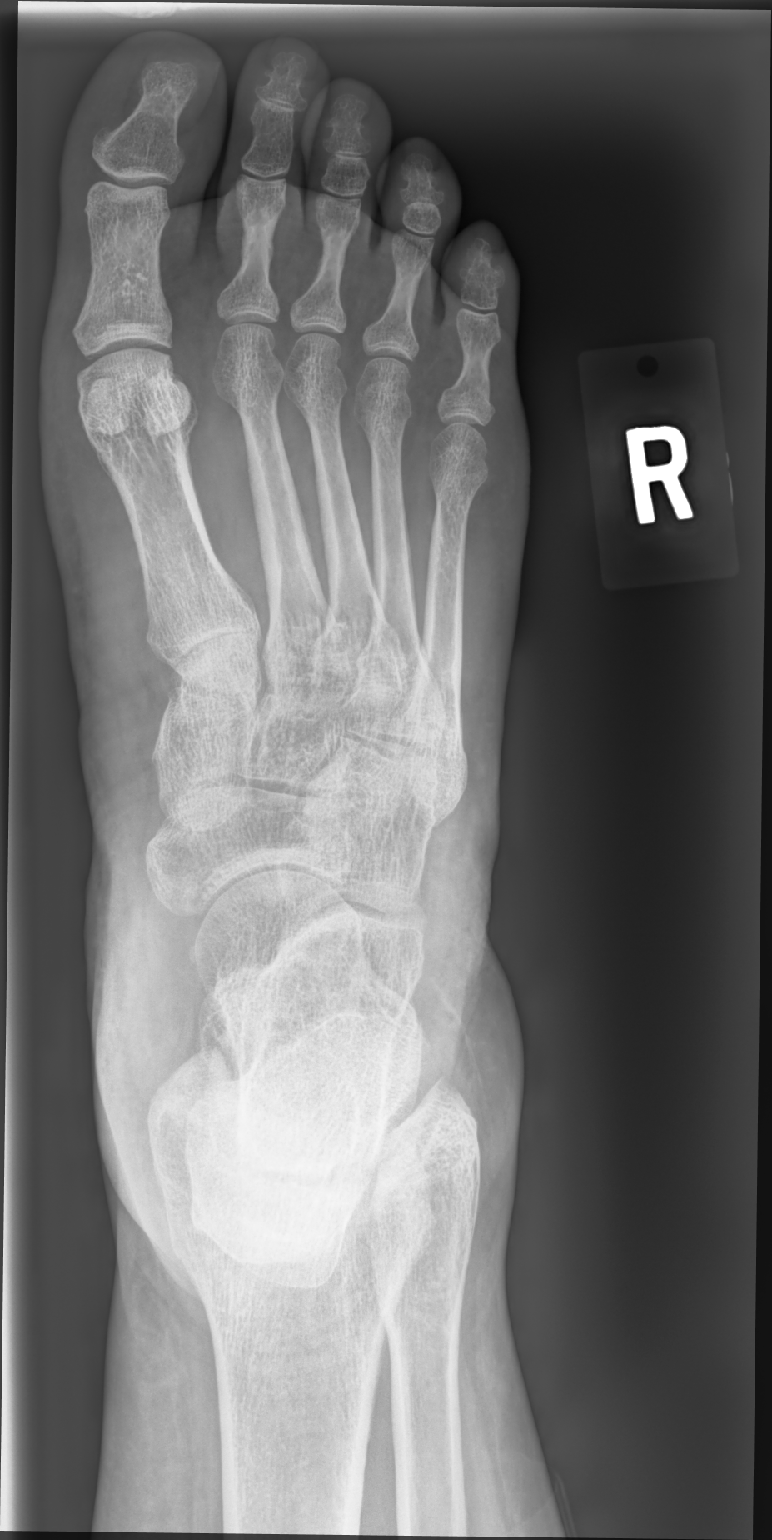

[foot mlo]
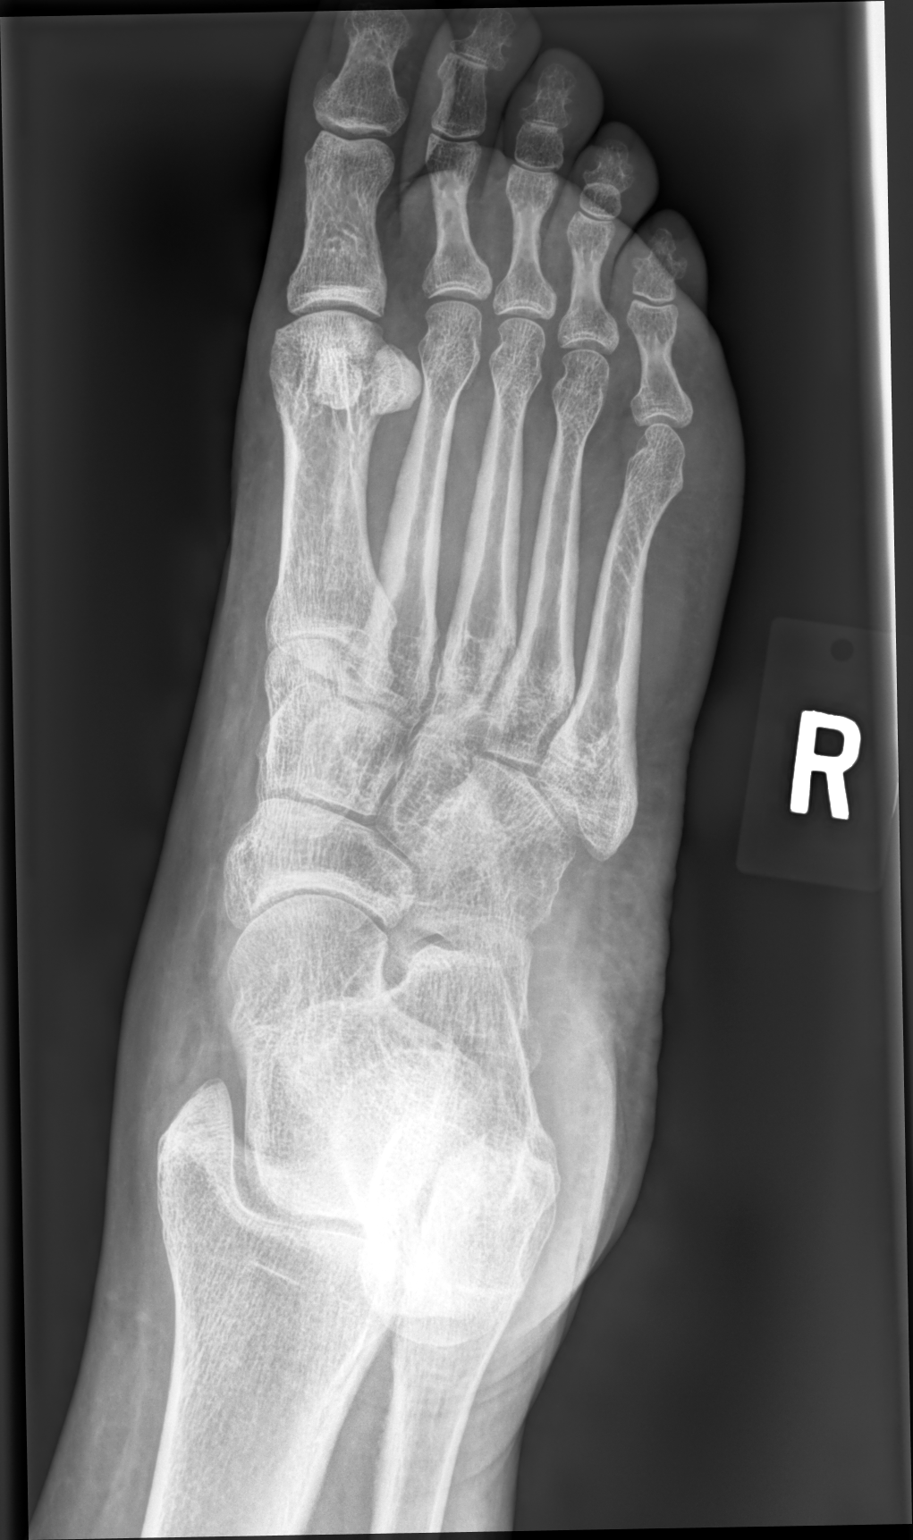

[foot lat]
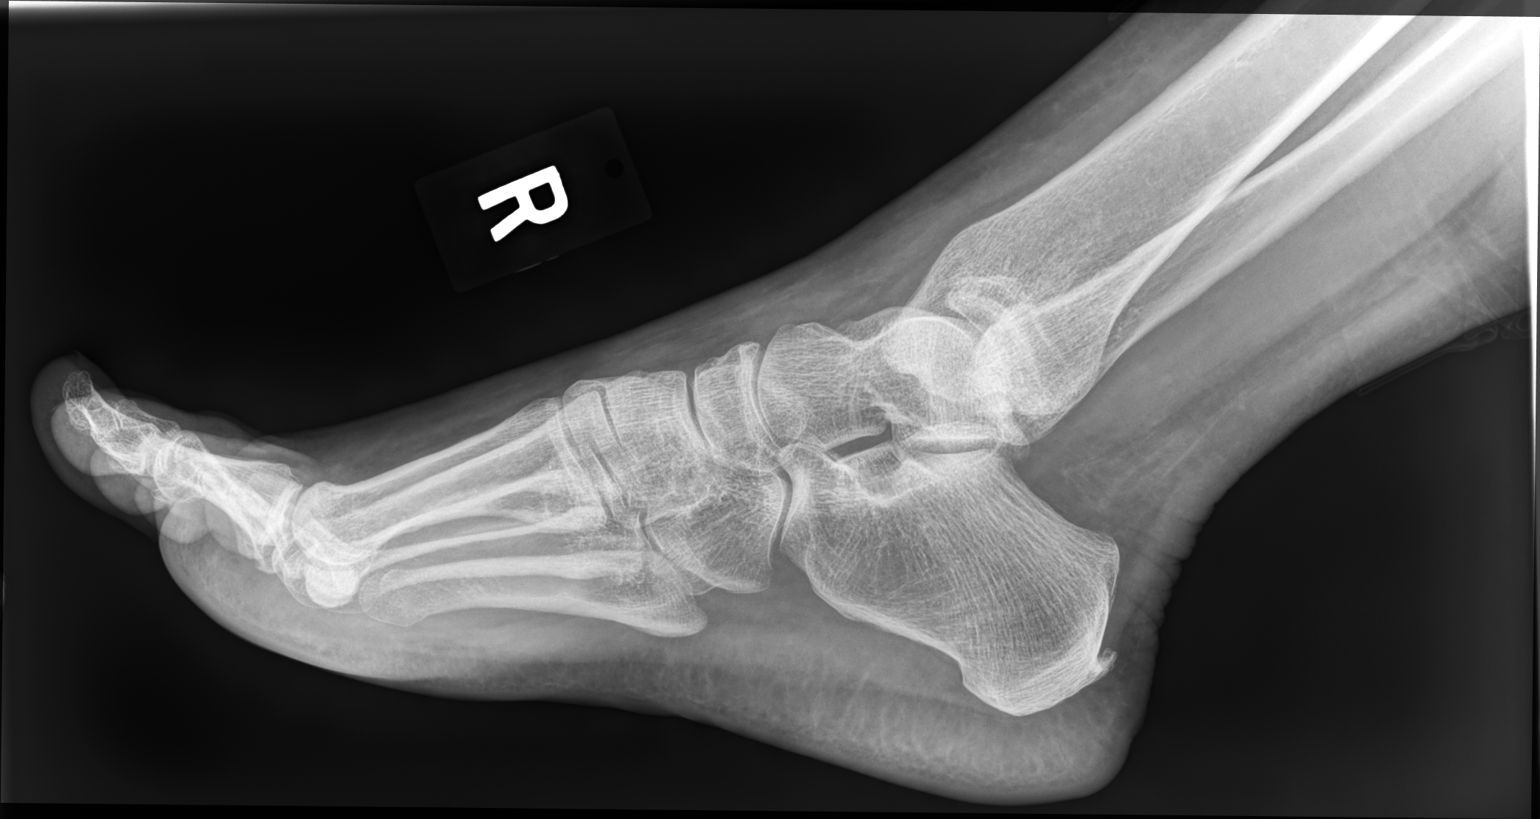

[3 of 3 positions shown; findings below may reference images not displayed]

FINDINGS: Frontal, oblique, and lateral views were obtained. No fracture or
dislocation. Joint spaces appear normal. No erosive change. No
appreciable plantar fascia calcification. There is a small posterior
calcaneal spur.
IMPRESSION: Small posterior calcaneal spur. No evident plantar fascial
calcification. No fracture or dislocation. No appreciable
arthropathy.

## 2022-05-11 NOTE — Progress Notes (Signed)
Name: Alexis Alvarado  MRN/ DOB: [id number], September 08, 1975    Age/ Sex: 47 y.o., female     PCP: Mliss Sax, MD   Reason for Endocrinology Evaluation: Hypothyroidism     Initial Endocrinology Clinic Visit: 10/22/2020    PATIENT IDENTIFIER: Alexis Alvarado is a 47 y.o., female with a past medical history of hypothyroidism. She has followed with [facility] since 10/22/2020  for consultative assistance with management of her hypothyroidism.   HISTORICAL SUMMARY: The patient was first diagnosed with hypothyroidism in 2011 , she has been on LT-4 replacement therapy since her diagnosis.   No XRT   Maternal GM with thyroid disease   SUBJECTIVE:    Today (05/12/2022):  Alexis Alvarado is here for a follow up on hypothyroidism.   Denies local neck swelling  Continues with constipation and bloating  Denies  palpitations  Denies tremors  She has Mirena  No Biotin   Levothyroxine 150 mcg daily    HISTORY:  Past Medical History:  Past Medical History:  Diagnosis Date   Anxiety    Chronic back pain    Hypothyroidism    Thyroid disease    hypothyroid   Past Surgical History:  Past Surgical History:  Procedure Laterality Date   LEG SURGERY     TUBAL LIGATION     tubual reversal     Social History:  reports that she quit smoking about 21 years ago. Her smoking use included cigarettes. She has never used smokeless tobacco. She reports that she does not drink alcohol and does not use drugs. Family History:  Family History  Problem Relation Age of Onset   COPD Mother    Colon polyps Father    Hypertension Father    Thyroid disease Maternal Grandmother    Colon cancer Neg Hx    Esophageal cancer Neg Hx    Rectal cancer Neg Hx    Stomach cancer Neg Hx      HOME MEDICATIONS: Allergies as of 05/12/2022       Reactions   Dexamethasone Rash   Patient states she is not allergic.         Medication List        Accurate as of May 12, 2022   9:01 AM. If you have any questions, ask your nurse or doctor.          STOP taking these medications    fluticasone 50 MCG/ACT nasal spray Commonly known as: FLONASE Stopped by: Scarlette Shorts, MD   predniSONE 10 MG tablet Commonly known as: DELTASONE Stopped by: Scarlette Shorts, MD   valACYclovir 1000 MG tablet Commonly known as: VALTREX Stopped by: Scarlette Shorts, MD       TAKE these medications    levothyroxine 150 MCG tablet Commonly known as: SYNTHROID Take 1 tablet (150 mcg total) by mouth daily.   Mirena (52 MG) 20 MCG/DAY Iud Generic drug: levonorgestrel Mirena 20 mcg/24 hours (6 yrs) 52 mg intrauterine device  Take 1 device by intrauterine route.          OBJECTIVE:   PHYSICAL EXAM: VS: BP 118/72 (BP Location: Left Arm, Patient Position: Sitting, Cuff Size: Small)   Pulse 95   Ht  (1.651 m)   Wt 220 lb (99.8 kg)   SpO2 99%   BMI 36.61 kg/m    EXAM: General: Pt appears well and is in NAD  Eyes: External eye exam normal without stare, lid lag or exophthalmos.  EOM  intact.    Neck: General: Supple without adenopathy. Thyroid: Thyroid size normal.  No goiter or nodules appreciated. No thyroid bruit.  Lungs: Clear with good BS bilat with no rales, rhonchi, or wheezes  Heart: Auscultation: RRR.  Abdomen: Normoactive bowel sounds, soft, nontender, without masses or organomegaly palpable  Extremities:  BL LE: Trace  pretibial edema   Mental Status: Judgment, insight: Intact Orientation: Oriented to time, place, and person Mood and affect: No depression, anxiety, or agitation     DATA REVIEWED:  Latest Reference Range & Units 05/12/22 09:13  TSH 0.35 - 5.50 uIU/mL 0.97  T4,Free(Direct) 0.60 - 1.60 ng/dL 1.61      ASSESSMENT / PLAN / RECOMMENDATIONS:   Hypothyroidism:   -Patient is clinically euthyroid -No local neck symptoms - Pt educated extensively on the correct way to take levothyroxine (first thing in the  morning with water, 30 minutes before eating or taking other medications). - Pt encouraged to double dose the following day if she were to miss a dose given long half-life of levothyroxine. -TSH remains within normal range   Medications   Continue levothyroxine 150 mcg daily   2. LE edema :  - Discussed low salt diet and leg elevation    Follow-up in 6 months  Signed electronically by: Lyndle Herrlich, MD  [facility]  [facility] [address] [place] [place], [place] [address] Phone: [phone] FAX: [phone]      CC: Mliss Sax, MD [address] [place] [address] Phone: [phone]  Fax: [phone]   Return to Endocrinology clinic as below: No future appointments.

## 2022-05-12 ENCOUNTER — Ambulatory Visit (INDEPENDENT_AMBULATORY_CARE_PROVIDER_SITE_OTHER): Payer: PRIVATE HEALTH INSURANCE | Admitting: Internal Medicine

## 2022-05-12 ENCOUNTER — Encounter: Payer: Self-pay | Admitting: Internal Medicine

## 2022-05-12 VITALS — BP 118/72 | HR 95 | Ht 65.0 in | Wt 220.0 lb

## 2022-05-12 DIAGNOSIS — E039 Hypothyroidism, unspecified: Secondary | ICD-10-CM

## 2022-05-12 LAB — T4, FREE: Free T4: 1.27 ng/dL (ref 0.60–1.60)

## 2022-05-12 LAB — TSH: TSH: 0.97 u[IU]/mL (ref 0.35–5.50)

## 2022-05-12 MED ORDER — LEVOTHYROXINE SODIUM 150 MCG PO TABS: 150.0000 ug | ORAL_TABLET | Freq: Every day | ORAL | 3 refills | Status: DC

## 2022-05-12 NOTE — Patient Instructions (Signed)

## 2022-06-08 ENCOUNTER — Ambulatory Visit (INDEPENDENT_AMBULATORY_CARE_PROVIDER_SITE_OTHER): Payer: PRIVATE HEALTH INSURANCE | Admitting: Family Medicine

## 2022-06-08 VITALS — BP 140/86 | HR 112 | Temp 98.3°F | Ht 65.0 in | Wt 219.0 lb

## 2022-06-08 DIAGNOSIS — R030 Elevated blood-pressure reading, without diagnosis of hypertension: Secondary | ICD-10-CM

## 2022-06-08 DIAGNOSIS — F341 Dysthymic disorder: Secondary | ICD-10-CM

## 2022-06-08 DIAGNOSIS — F4320 Adjustment disorder, unspecified: Secondary | ICD-10-CM | POA: Diagnosis not present

## 2022-06-08 DIAGNOSIS — F419 Anxiety disorder, unspecified: Secondary | ICD-10-CM

## 2022-06-08 MED ORDER — ESCITALOPRAM OXALATE 10 MG PO TABS: 10.0000 mg | ORAL_TABLET | Freq: Every day | ORAL | 1 refills | Status: DC

## 2022-06-08 NOTE — Progress Notes (Addendum)
Established Patient Office Visit   Subjective:  Patient ID: Alexis Alvarado, female    DOB: [birth year]  Age: 47 y.o. MRN: [id number]  Chief Complaint  Patient presents with   Anxiety    Anxiety issues x 2-3 weeks.     Anxiety Symptoms include insomnia and nervous/anxious behavior. Patient reports no suicidal ideas.     Encounter Diagnoses  Name Primary?   Dysthymia Yes   Anxiety    Anticipatory grieving    Elevated BP without diagnosis of hypertension    Here today in emotional distress.  Her mother is in home hospice care at patient's house.  Mother is dying from lung cancer with metastatic disease.  She continues to work from home.  She also has an 32-year-old son.  Continues follow-up with endocrinology thyroidism.   Review of Systems  Constitutional: Negative.   HENT: Negative.    Eyes:  Negative for blurred vision, discharge and redness.  Respiratory: Negative.    Cardiovascular: Negative.   Gastrointestinal:  Negative for abdominal pain.  Genitourinary: Negative.   Musculoskeletal: Negative.  Negative for myalgias.  Skin:  Negative for rash.  Neurological:  Negative for tingling, loss of consciousness and weakness.  Endo/Heme/Allergies:  Negative for polydipsia.  Psychiatric/Behavioral:  Positive for depression. Negative for substance abuse and suicidal ideas. The patient is nervous/anxious and has insomnia.       06/08/2022    2:34 PM 08/11/2020    9:03 AM 05/18/2020    3:40 PM  Depression screen PHQ 2/9  Decreased Interest 0 0 0  Down, Depressed, Hopeless 0 0 0  PHQ - 2 Score 0 0 0      Current Outpatient Medications:    escitalopram (LEXAPRO) 10 MG tablet, Take 1 tablet (10 mg total) by mouth at bedtime., Disp: 30 tablet, Rfl: 1   levonorgestrel (MIRENA, 52 MG,) 20 MCG/24HR IUD, Mirena 20 mcg/24 hours (6 yrs) 52 mg intrauterine device  Take 1 device by intrauterine route., Disp: , Rfl:    levothyroxine (SYNTHROID) 150 MCG tablet, Take 1 tablet (150 mcg  total) by mouth daily., Disp: 90 tablet, Rfl: 3   Objective:     BP (!) 140/86 (BP Location: Right Arm, Patient Position: Sitting, Cuff Size: Large)   Pulse (!) 112   Temp 98.3 F (36.8 C) (Temporal)   Ht 5\' 5"  (1.651 m)   Wt 219 lb (99.3 kg)   SpO2 98%   BMI 36.44 kg/m  BP Readings from Last 3 Encounters:  06/08/22 (!) 140/86  05/12/22 118/72  11/10/21 124/80   Wt Readings from Last 3 Encounters:  06/08/22 219 lb (99.3 kg)  05/12/22 220 lb (99.8 kg)  11/10/21 214 lb (97.1 kg)      Physical Exam Constitutional:      General: She is not in acute distress.    Appearance: Normal appearance. She is not ill-appearing, toxic-appearing or diaphoretic.  HENT:     Head: Normocephalic and atraumatic.     Right Ear: External ear normal.     Left Ear: External ear normal.  Eyes:     General: No scleral icterus.       Right eye: No discharge.        Left eye: No discharge.     Extraocular Movements: Extraocular movements intact.     Conjunctiva/sclera: Conjunctivae normal.  Pulmonary:     Effort: Pulmonary effort is normal. No respiratory distress.  Skin:    General: Skin is warm and dry.  Neurological:  Mental Status: She is alert and oriented to person, place, and time.  Psychiatric:        Mood and Affect: Mood normal. Affect is tearful.        Behavior: Behavior normal.      No results found for any visits on 06/08/22.    The 10-year ASCVD risk score (Arnett DK, et al., 2019) is: 1.5%    Assessment & Plan:   Dysthymia -     Escitalopram Oxalate; Take 1 tablet (10 mg total) by mouth at bedtime.  Dispense: 30 tablet; Refill: 1  Anxiety -     Escitalopram Oxalate; Take 1 tablet (10 mg total) by mouth at bedtime.  Dispense: 30 tablet; Refill: 1  Anticipatory grieving  Elevated BP without diagnosis of hypertension    Return in about 4 weeks (around 07/06/2022).    Mliss Sax, MD

## 2022-07-06 ENCOUNTER — Ambulatory Visit: Payer: PRIVATE HEALTH INSURANCE | Admitting: Family Medicine

## 2022-07-21 ENCOUNTER — Ambulatory Visit (INDEPENDENT_AMBULATORY_CARE_PROVIDER_SITE_OTHER): Payer: PRIVATE HEALTH INSURANCE | Admitting: Family Medicine

## 2022-07-21 ENCOUNTER — Encounter: Payer: Self-pay | Admitting: Family Medicine

## 2022-07-21 VITALS — BP 136/84 | HR 66 | Temp 97.0°F | Ht 65.0 in | Wt 216.8 lb

## 2022-07-21 DIAGNOSIS — F341 Dysthymic disorder: Secondary | ICD-10-CM | POA: Diagnosis not present

## 2022-07-21 DIAGNOSIS — F419 Anxiety disorder, unspecified: Secondary | ICD-10-CM | POA: Diagnosis not present

## 2022-07-21 MED ORDER — ESCITALOPRAM OXALATE 20 MG PO TABS: 20.0000 mg | ORAL_TABLET | Freq: Every day | ORAL | 1 refills | Status: DC

## 2022-07-21 NOTE — Progress Notes (Signed)
Established Patient Office Visit   Subjective:  Patient ID: Alexis Alvarado, female    DOB: [birth year]  Age: 47 y.o. MRN: [id number]  Chief Complaint  Patient presents with   Follow-up    4 week follow up for Dysthymia. Pt states she is doing a little better new Rx for Lexapro seems to work well. No other concerns or questions.     HPI Encounter Diagnoses  Name Primary?   Anxiety Yes   Dysthymia    Follow-up of above.  Her mother passed back on the 12th.  Of course grieving continues.  She has been helping her father.  The medication is helping with anxiety.  No further panic attacks.  She is interested in higher dose for mood elevation.   Review of Systems  Constitutional: Negative.   HENT: Negative.    Eyes:  Negative for blurred vision, discharge and redness.  Respiratory: Negative.    Cardiovascular: Negative.   Gastrointestinal:  Negative for abdominal pain.  Genitourinary: Negative.   Musculoskeletal: Negative.  Negative for myalgias.  Skin:  Negative for rash.  Neurological:  Negative for tingling, loss of consciousness and weakness.  Endo/Heme/Allergies:  Negative for polydipsia.      07/21/2022    9:14 AM 06/08/2022    4:02 PM 06/08/2022    2:34 PM  Depression screen PHQ 2/9  Decreased Interest 0 1 0  Down, Depressed, Hopeless 0 2 0  PHQ - 2 Score 0 3 0  Altered sleeping  3   Tired, decreased energy  3   Change in appetite  2   Feeling bad or failure about yourself   0   Trouble concentrating  1   Moving slowly or fidgety/restless  1   Suicidal thoughts  0   PHQ-9 Score  13   Difficult doing work/chores  Very difficult        Current Outpatient Medications:    escitalopram (LEXAPRO) 20 MG tablet, Take 1 tablet (20 mg total) by mouth daily., Disp: 90 tablet, Rfl: 1   levonorgestrel (MIRENA, 52 MG,) 20 MCG/24HR IUD, Mirena 20 mcg/24 hours (6 yrs) 52 mg intrauterine device  Take 1 device by intrauterine route., Disp: , Rfl:    levothyroxine (SYNTHROID)  150 MCG tablet, Take 1 tablet (150 mcg total) by mouth daily., Disp: 90 tablet, Rfl: 3   Objective:     BP 136/84   Pulse 66   Temp (!) 97 F (36.1 C)   Ht 5\' 5"  (1.651 m)   Wt 216 lb 12.8 oz (98.3 kg)   BMI 36.08 kg/m    Physical Exam Constitutional:      General: She is not in acute distress.    Appearance: Normal appearance. She is not ill-appearing, toxic-appearing or diaphoretic.  HENT:     Head: Normocephalic and atraumatic.     Right Ear: External ear normal.     Left Ear: External ear normal.  Eyes:     General: No scleral icterus.       Right eye: No discharge.        Left eye: No discharge.     Extraocular Movements: Extraocular movements intact.     Conjunctiva/sclera: Conjunctivae normal.  Pulmonary:     Effort: Pulmonary effort is normal. No respiratory distress.  Skin:    General: Skin is warm and dry.  Neurological:     Mental Status: She is alert and oriented to person, place, and time.  Psychiatric:  Mood and Affect: Mood normal.        Behavior: Behavior normal.      No results found for any visits on 07/21/22.    The 10-year ASCVD risk score (Arnett DK, et al., 2019) is: 1.4%    Assessment & Plan:   Anxiety -     Escitalopram Oxalate; Take 1 tablet (20 mg total) by mouth daily.  Dispense: 90 tablet; Refill: 1  Dysthymia -     Escitalopram Oxalate; Take 1 tablet (20 mg total) by mouth daily.  Dispense: 90 tablet; Refill: 1    Return in about 3 months (around 10/21/2022), or if symptoms worsen or fail to improve.   Increase dose to 20 mg daily. Mliss Sax, MD

## 2022-09-07 ENCOUNTER — Encounter (INDEPENDENT_AMBULATORY_CARE_PROVIDER_SITE_OTHER): Payer: Self-pay

## 2022-10-23 ENCOUNTER — Encounter: Payer: Self-pay | Admitting: Family Medicine

## 2022-10-23 ENCOUNTER — Ambulatory Visit (INDEPENDENT_AMBULATORY_CARE_PROVIDER_SITE_OTHER): Payer: PRIVATE HEALTH INSURANCE | Admitting: Family Medicine

## 2022-10-23 VITALS — BP 122/80 | HR 57 | Temp 98.2°F | Ht 65.0 in | Wt 221.0 lb

## 2022-10-23 DIAGNOSIS — F4321 Adjustment disorder with depressed mood: Secondary | ICD-10-CM | POA: Diagnosis not present

## 2022-10-23 DIAGNOSIS — Z23 Encounter for immunization: Secondary | ICD-10-CM | POA: Insufficient documentation

## 2022-10-23 DIAGNOSIS — F418 Other specified anxiety disorders: Secondary | ICD-10-CM | POA: Diagnosis not present

## 2022-10-23 DIAGNOSIS — G4700 Insomnia, unspecified: Secondary | ICD-10-CM | POA: Diagnosis not present

## 2022-10-23 MED ORDER — ESCITALOPRAM OXALATE 10 MG PO TABS: 30.0000 mg | ORAL_TABLET | Freq: Every day | ORAL | 2 refills | Status: DC

## 2022-10-23 MED ORDER — ESZOPICLONE 2 MG PO TABS: 2.0000 mg | ORAL_TABLET | Freq: Every evening | ORAL | 2 refills | Status: DC | PRN

## 2022-10-23 NOTE — Progress Notes (Signed)
Established Patient Office Visit   Subjective:  Patient ID: Alexis Alvarado, female    DOB: [birth year]  Age: 47 y.o. MRN: [id number]  Chief Complaint  Patient presents with   Medical Management of Chronic Issues    3 month follow up. Pt states she is having trouble sleeping, can't stay sleep.     HPI Encounter Diagnoses  Name Primary?   Depression with anxiety Yes   Need for immunization against influenza    Insomnia, unspecified type    Grieving    For follow-up of above.  Mom passed June 6.  Sadness is somewhat improved but persisting with escitalopram.  Having difficulty staying asleep.  Feels a lot of anxiety.  Of course, she continues to grieve.  Her father is still living.  She does have siblings.  No regular exercise.   Review of Systems  Constitutional: Negative.   HENT: Negative.    Eyes:  Negative for blurred vision, discharge and redness.  Respiratory: Negative.    Cardiovascular: Negative.   Gastrointestinal:  Negative for abdominal pain.  Genitourinary: Negative.   Musculoskeletal: Negative.  Negative for myalgias.  Skin:  Negative for rash.  Neurological:  Negative for tingling, loss of consciousness and weakness.  Endo/Heme/Allergies:  Negative for polydipsia.  Psychiatric/Behavioral:  The patient is nervous/anxious and has insomnia.      Current Outpatient Medications:    escitalopram (LEXAPRO) 10 MG tablet, Take 3 tablets (30 mg total) by mouth daily., Disp: 270 tablet, Rfl: 2   eszopiclone (LUNESTA) 2 MG TABS tablet, Take 1 tablet (2 mg total) by mouth at bedtime as needed for sleep. Take immediately before bedtime, Disp: 30 tablet, Rfl: 2   levonorgestrel (MIRENA, 52 MG,) 20 MCG/24HR IUD, Mirena 20 mcg/24 hours (6 yrs) 52 mg intrauterine device  Take 1 device by intrauterine route., Disp: , Rfl:    levothyroxine (SYNTHROID) 150 MCG tablet, Take 1 tablet (150 mcg total) by mouth daily., Disp: 90 tablet, Rfl: 3   Objective:     BP 122/80   Pulse (!)  57   Temp 98.2 F (36.8 C)   Ht 5\' 5"  (1.651 m)   Wt 221 lb (100.2 kg)   SpO2 97%   BMI 36.78 kg/m  Wt Readings from Last 3 Encounters:  10/23/22 221 lb (100.2 kg)  07/21/22 216 lb 12.8 oz (98.3 kg)  06/08/22 219 lb (99.3 kg)      Physical Exam Constitutional:      General: She is not in acute distress.    Appearance: Normal appearance. She is not ill-appearing, toxic-appearing or diaphoretic.  HENT:     Head: Normocephalic and atraumatic.     Right Ear: External ear normal.     Left Ear: External ear normal.  Eyes:     General: No scleral icterus.       Right eye: No discharge.        Left eye: No discharge.     Extraocular Movements: Extraocular movements intact.     Conjunctiva/sclera: Conjunctivae normal.  Pulmonary:     Effort: Pulmonary effort is normal. No respiratory distress.  Skin:    General: Skin is warm and dry.  Neurological:     Mental Status: She is alert and oriented to person, place, and time.  Psychiatric:        Mood and Affect: Mood normal.        Behavior: Behavior normal.      No results found for any visits on 10/23/22.  The 10-year ASCVD risk score (Arnett DK, et al., 2019) is: 1.2%    Assessment & Plan:   Depression with anxiety -     Escitalopram Oxalate; Take 3 tablets (30 mg total) by mouth daily.  Dispense: 270 tablet; Refill: 2  Need for immunization against influenza -     Flu vaccine trivalent PF, 6mos and older(Flulaval,Afluria,Fluarix,Fluzone)  Insomnia, unspecified type -     Eszopiclone; Take 1 tablet (2 mg total) by mouth at bedtime as needed for sleep. Take immediately before bedtime  Dispense: 30 tablet; Refill: 2  Grieving    Return in about 8 weeks (around 12/18/2022).  Increase Lexapro to 30 mg daily.  May try taking it in the morning.  Lunesta at at bedtime just before bed as needed.  Encouraged regular exercise 5 walking.  Mliss Sax, MD

## 2022-10-25 ENCOUNTER — Telehealth: Payer: Self-pay | Admitting: Family Medicine

## 2022-10-25 DIAGNOSIS — F418 Other specified anxiety disorders: Secondary | ICD-10-CM

## 2022-10-25 NOTE — Telephone Encounter (Signed)
Medication management   escitalopram (LEXAPRO) 10 MG tablet [[id number]]   Pharmacy has told her they need more information before they can fill.   Please call pahrmacy  [facility] - [place] - [address] [place] [address] Phone: [phone]  Fax: [phone]

## 2022-10-26 MED ORDER — ESCITALOPRAM OXALATE 10 MG PO TABS: 30.0000 mg | ORAL_TABLET | Freq: Every day | ORAL | 2 refills | Status: DC

## 2022-11-01 ENCOUNTER — Encounter: Payer: Self-pay | Admitting: Family Medicine

## 2022-12-18 ENCOUNTER — Ambulatory Visit: Payer: PRIVATE HEALTH INSURANCE | Admitting: Family Medicine

## 2022-12-26 ENCOUNTER — Ambulatory Visit: Payer: PRIVATE HEALTH INSURANCE | Admitting: Family Medicine

## 2023-01-02 ENCOUNTER — Ambulatory Visit (INDEPENDENT_AMBULATORY_CARE_PROVIDER_SITE_OTHER): Payer: PRIVATE HEALTH INSURANCE | Admitting: Family Medicine

## 2023-01-02 ENCOUNTER — Encounter: Payer: Self-pay | Admitting: Family Medicine

## 2023-01-02 VITALS — BP 130/84 | HR 70 | Temp 97.1°F | Ht 65.0 in | Wt 226.6 lb

## 2023-01-02 DIAGNOSIS — F418 Other specified anxiety disorders: Secondary | ICD-10-CM | POA: Diagnosis not present

## 2023-01-02 NOTE — Progress Notes (Signed)
Established Patient Office Visit   Subjective:  Patient ID: Alexis Alvarado, female    DOB: 28-Sep-1975  Age: 47 y.o. MRN: [id number]  No chief complaint on file.   HPI Encounter Diagnoses  Name Primary?   Depression with anxiety Yes   For follow-up of above.  Higher dose of Lexapro seems to be helping.  Alfonso Patten is working well for sleep.  Things are slowly getting better but she continues to grieve of course.  She lives at home with her husband and 68-year-old son.   Review of Systems  Constitutional: Negative.   HENT: Negative.    Eyes:  Negative for blurred vision, discharge and redness.  Respiratory: Negative.    Cardiovascular: Negative.   Gastrointestinal:  Negative for abdominal pain.  Genitourinary: Negative.   Musculoskeletal: Negative.  Negative for myalgias.  Skin:  Negative for rash.  Neurological:  Negative for tingling, loss of consciousness and weakness.  Endo/Heme/Allergies:  Negative for polydipsia.      01/02/2023   11:04 AM 10/23/2022    9:12 AM 07/21/2022    9:14 AM  Depression screen PHQ 2/9  Decreased Interest 1 1 0  Down, Depressed, Hopeless 1 0 0  PHQ - 2 Score 2 1 0  Altered sleeping 1 3   Tired, decreased energy 1 3   Change in appetite 1 1   Feeling bad or failure about yourself  0 0   Trouble concentrating 1 1   Moving slowly or fidgety/restless 0 1   Suicidal thoughts 0 0   PHQ-9 Score 6 10   Difficult doing work/chores Not difficult at all Very difficult        Current Outpatient Medications:    escitalopram (LEXAPRO) 10 MG tablet, Take 3 tablets (30 mg total) by mouth daily., Disp: 270 tablet, Rfl: 2   eszopiclone (LUNESTA) 2 MG TABS tablet, Take 1 tablet (2 mg total) by mouth at bedtime as needed for sleep. Take immediately before bedtime, Disp: 30 tablet, Rfl: 2   levonorgestrel (MIRENA, 52 MG,) 20 MCG/24HR IUD, Mirena 20 mcg/24 hours (6 yrs) 52 mg intrauterine device  Take 1 device by intrauterine route., Disp: , Rfl:     levothyroxine (SYNTHROID) 150 MCG tablet, Take 1 tablet (150 mcg total) by mouth daily., Disp: 90 tablet, Rfl: 3   Objective:     BP 130/84   Pulse 70   Temp (!) 97.1 F (36.2 C)   Ht 5\' 5"  (1.651 m)   Wt 226 lb 9.6 oz (102.8 kg)   SpO2 97%   BMI 37.71 kg/m  Wt Readings from Last 3 Encounters:  01/02/23 226 lb 9.6 oz (102.8 kg)  10/23/22 221 lb (100.2 kg)  07/21/22 216 lb 12.8 oz (98.3 kg)      Physical Exam Constitutional:      General: She is not in acute distress.    Appearance: Normal appearance. She is not ill-appearing, toxic-appearing or diaphoretic.  HENT:     Head: Normocephalic and atraumatic.     Right Ear: External ear normal.     Left Ear: External ear normal.  Eyes:     General: No scleral icterus.       Right eye: No discharge.        Left eye: No discharge.     Extraocular Movements: Extraocular movements intact.     Conjunctiva/sclera: Conjunctivae normal.  Pulmonary:     Effort: Pulmonary effort is normal. No respiratory distress.  Skin:    General: Skin is  warm and dry.  Neurological:     Mental Status: She is alert and oriented to person, place, and time.  Psychiatric:        Mood and Affect: Mood normal.        Behavior: Behavior normal.      No results found for any visits on 01/02/23.    The 10-year ASCVD risk score (Arnett DK, et al., 2019) is: 1.4%    Assessment & Plan:   Depression with anxiety    Return in about 3 months (around 04/02/2023), or if symptoms worsen or fail to improve, for Return fasting in 3 months for blood work..  Continue medications as above.   Mliss Sax, MD

## 2023-04-03 ENCOUNTER — Ambulatory Visit (INDEPENDENT_AMBULATORY_CARE_PROVIDER_SITE_OTHER): Payer: PRIVATE HEALTH INSURANCE | Admitting: Family Medicine

## 2023-04-03 VITALS — BP 128/88 | HR 80 | Temp 98.1°F | Ht 65.0 in | Wt 229.8 lb

## 2023-04-03 DIAGNOSIS — G4700 Insomnia, unspecified: Secondary | ICD-10-CM | POA: Diagnosis not present

## 2023-04-03 DIAGNOSIS — Z13220 Encounter for screening for lipoid disorders: Secondary | ICD-10-CM | POA: Diagnosis not present

## 2023-04-03 DIAGNOSIS — F418 Other specified anxiety disorders: Secondary | ICD-10-CM

## 2023-04-03 DIAGNOSIS — Z131 Encounter for screening for diabetes mellitus: Secondary | ICD-10-CM

## 2023-04-03 DIAGNOSIS — Z0000 Encounter for general adult medical examination without abnormal findings: Secondary | ICD-10-CM | POA: Diagnosis not present

## 2023-04-03 LAB — URINALYSIS, ROUTINE W REFLEX MICROSCOPIC
Bilirubin Urine: NEGATIVE
Hgb urine dipstick: NEGATIVE
Ketones, ur: NEGATIVE
Nitrite: NEGATIVE
Specific Gravity, Urine: >=1.03 — AB (ref 1.000–1.030)
Total Protein, Urine: NEGATIVE
Urine Glucose: NEGATIVE
Urobilinogen, UA: 0.2 (ref 0.0–1.0)
pH: 6 (ref 5.0–8.0)

## 2023-04-03 LAB — COMPREHENSIVE METABOLIC PANEL
ALT: 48 U/L — ABNORMAL HIGH (ref 0–35)
AST: 26 U/L (ref 0–37)
Albumin: 4.3 g/dL (ref 3.5–5.2)
Alkaline Phosphatase: 65 U/L (ref 39–117)
BUN: 11 mg/dL (ref 6–23)
CO2: 30 meq/L (ref 19–32)
Calcium: 9.4 mg/dL (ref 8.4–10.5)
Chloride: 103 meq/L (ref 96–112)
Creatinine, Ser: 0.86 mg/dL (ref 0.40–1.20)
GFR: 80.4 mL/min (ref >60.00)
Glucose, Bld: 113 mg/dL — ABNORMAL HIGH (ref 70–99)
Potassium: 4.5 meq/L (ref 3.5–5.1)
Sodium: 140 meq/L (ref 135–145)
Total Bilirubin: 0.3 mg/dL (ref 0.2–1.2)
Total Protein: 6.7 g/dL (ref 6.0–8.3)

## 2023-04-03 LAB — CBC
HCT: 41.1 % (ref 36.0–46.0)
Hemoglobin: 13.9 g/dL (ref 12.0–15.0)
MCHC: 33.9 g/dL (ref 30.0–36.0)
MCV: 84.1 fl (ref 78.0–100.0)
Platelets: 210 10*3/uL (ref 150.0–400.0)
RBC: 4.89 Mil/uL (ref 3.87–5.11)
RDW: 13.7 % (ref 11.5–15.5)
WBC: 6.9 10*3/uL (ref 4.0–10.5)

## 2023-04-03 LAB — LIPID PANEL
Cholesterol: 226 mg/dL — ABNORMAL HIGH (ref 0–200)
HDL: 54 mg/dL (ref >39.00)
LDL Cholesterol: 138 mg/dL — ABNORMAL HIGH (ref 0–99)
NonHDL: 172.29
Total CHOL/HDL Ratio: 4
Triglycerides: 173 mg/dL — ABNORMAL HIGH (ref 0.0–149.0)
VLDL: 34.6 mg/dL (ref 0.0–40.0)

## 2023-04-03 LAB — HEMOGLOBIN A1C: Hgb A1c MFr Bld: 5.9 % (ref 4.6–6.5)

## 2023-04-03 MED ORDER — ESZOPICLONE 2 MG PO TABS: 2.0000 mg | ORAL_TABLET | Freq: Every evening | ORAL | 2 refills | Status: DC | PRN

## 2023-04-03 NOTE — Progress Notes (Signed)
 Established Patient Office Visit   Subjective:  Patient ID: Alexis Alvarado, female    DOB: 1975/09/04  Age: 48 y.o. MRN: [id number]  Chief Complaint  Patient presents with   Medical Management of Chronic Issues    3 month follow up. Pt is fasting. Rx refill for Lunesta.    HPI Encounter Diagnoses  Name Primary?   Healthcare maintenance Yes   Insomnia, unspecified type    Depression with anxiety    Screening for cholesterol level    Screening for diabetes mellitus    For health maintenance check and follow-up of depression with anxiety.  Lexapro continues to help.  Lunesta is helpful for sleep.  Continues with the grieving process of her mother's death.  She is walking some for exercise.  She has regular dental care.  She has yearly follow-up with GYN.   Review of Systems  Constitutional: Negative.   HENT: Negative.    Eyes:  Negative for blurred vision, discharge and redness.  Respiratory: Negative.    Cardiovascular: Negative.   Gastrointestinal:  Negative for abdominal pain.  Genitourinary: Negative.   Musculoskeletal: Negative.  Negative for myalgias.  Skin:  Negative for rash.  Neurological:  Negative for tingling, loss of consciousness and weakness.  Endo/Heme/Allergies:  Negative for polydipsia.     Current Outpatient Medications:    escitalopram (LEXAPRO) 10 MG tablet, Take 3 tablets (30 mg total) by mouth daily., Disp: 270 tablet, Rfl: 2   levonorgestrel (MIRENA, 52 MG,) 20 MCG/24HR IUD, Mirena 20 mcg/24 hours (6 yrs) 52 mg intrauterine device  Take 1 device by intrauterine route., Disp: , Rfl:    levothyroxine (SYNTHROID) 150 MCG tablet, Take 1 tablet (150 mcg total) by mouth daily., Disp: 90 tablet, Rfl: 3   eszopiclone (LUNESTA) 2 MG TABS tablet, Take 1 tablet (2 mg total) by mouth at bedtime as needed for sleep. Take immediately before bedtime, Disp: 30 tablet, Rfl: 2   Objective:     BP 128/88   Pulse 80   Temp 98.1 F (36.7 C)   Ht 5\' 5"  (1.651 m)    Wt 229 lb 12.8 oz (104.2 kg)   SpO2 95%   BMI 38.24 kg/m    Physical Exam Constitutional:      General: She is not in acute distress.    Appearance: Normal appearance. She is not ill-appearing, toxic-appearing or diaphoretic.  HENT:     Head: Normocephalic and atraumatic.     Right Ear: Tympanic membrane, ear canal and external ear normal.     Left Ear: Tympanic membrane, ear canal and external ear normal.     Mouth/Throat:     Mouth: Mucous membranes are moist.     Pharynx: Oropharynx is clear. No oropharyngeal exudate or posterior oropharyngeal erythema.  Eyes:     General: No scleral icterus.       Right eye: No discharge.        Left eye: No discharge.     Extraocular Movements: Extraocular movements intact.     Conjunctiva/sclera: Conjunctivae normal.     Pupils: Pupils are equal, round, and reactive to light.  Cardiovascular:     Rate and Rhythm: Normal rate and regular rhythm.  Pulmonary:     Effort: Pulmonary effort is normal. No respiratory distress.     Breath sounds: Normal breath sounds.  Abdominal:     General: Bowel sounds are normal.  Musculoskeletal:     Cervical back: No rigidity or tenderness.  Skin:  General: Skin is warm and dry.  Neurological:     Mental Status: She is alert and oriented to person, place, and time.  Psychiatric:        Mood and Affect: Mood normal.        Behavior: Behavior normal.      No results found for any visits on 04/03/23.    The 10-year ASCVD risk score (Arnett DK, et al., 2019) is: 1.3%    Assessment & Plan:   Healthcare maintenance -     CBC -     Urinalysis, Routine w reflex microscopic  Insomnia, unspecified type -     Eszopiclone; Take 1 tablet (2 mg total) by mouth at bedtime as needed for sleep. Take immediately before bedtime  Dispense: 30 tablet; Refill: 2  Depression with anxiety  Screening for cholesterol level -     Comprehensive metabolic panel -     Lipid panel  Screening for diabetes  mellitus -     Comprehensive metabolic panel -     Hemoglobin A1c    Return in about 6 months (around 10/04/2023).  Continue daily Lexapro and Lunesta as needed.  Encouraged regular exercise for 30 minutes daily.  Information was given on exercising to lose weight.  Information given on health maintenance and disease prevention.  Screening labs checked today.  Mliss Sax, MD

## 2023-05-14 ENCOUNTER — Ambulatory Visit: Payer: PRIVATE HEALTH INSURANCE | Admitting: Internal Medicine

## 2023-05-14 NOTE — Progress Notes (Deleted)
 Name: Alexis Alvarado  MRN/ DOB: [id number], July 15, 1975    Age/ Sex: 48 y.o., female     PCP: Tonna Frederic, MD   Reason for Endocrinology Evaluation: Hypothyroidism     Initial Endocrinology Clinic Visit: 10/22/2020    PATIENT IDENTIFIER: Alexis Alvarado is a 48 y.o., female with a past medical history of hypothyroidism. She has followed with [facility] since 10/22/2020  for consultative assistance with management of her hypothyroidism.   HISTORICAL SUMMARY: The patient was first diagnosed with hypothyroidism in 2011 , she has been on LT-4 replacement therapy since her diagnosis.   No XRT   Maternal GM with thyroid  disease   SUBJECTIVE:    Today (05/14/2023):  Alexis Alvarado is here for a follow up on hypothyroidism.   Denies local neck swelling  Continues with constipation and bloating  Denies  palpitations  Denies tremors  She has Mirena  No Biotin   Levothyroxine  150 mcg daily    HISTORY:  Past Medical History:  Past Medical History:  Diagnosis Date   Anxiety    Chronic back pain    Hypothyroidism    Thyroid  disease    hypothyroid   Past Surgical History:  Past Surgical History:  Procedure Laterality Date   LEG SURGERY     TUBAL LIGATION     tubual reversal     Social History:  reports that she quit smoking about 22 years ago. Her smoking use included cigarettes. She has never used smokeless tobacco. She reports that she does not drink alcohol and does not use drugs. Family History:  Family History  Problem Relation Age of Onset   COPD Mother    Colon polyps Father    Hypertension Father    Thyroid  disease Maternal Grandmother    Colon cancer Neg Hx    Esophageal cancer Neg Hx    Rectal cancer Neg Hx    Stomach cancer Neg Hx      HOME MEDICATIONS: Allergies as of 05/14/2023       Reactions   Dexamethasone Rash   Patient states she is not allergic.         Medication List        Accurate as of May 14, 2023   7:11 AM. If you have any questions, ask your nurse or doctor.          escitalopram  10 MG tablet Commonly known as: LEXAPRO  Take 3 tablets (30 mg total) by mouth daily.   eszopiclone  2 MG Tabs tablet Commonly known as: Lunesta  Take 1 tablet (2 mg total) by mouth at bedtime as needed for sleep. Take immediately before bedtime   levothyroxine  150 MCG tablet Commonly known as: SYNTHROID  Take 1 tablet (150 mcg total) by mouth daily.   Mirena (52 MG) 20 MCG/24HR Iud Generic drug: levonorgestrel Mirena 20 mcg/24 hours (6 yrs) 52 mg intrauterine device  Take 1 device by intrauterine route.          OBJECTIVE:   PHYSICAL EXAM: VS: There were no vitals taken for this visit.   EXAM: General: Pt appears well and is in NAD  Eyes: External eye exam normal without stare, lid lag or exophthalmos.  EOM intact.    Neck: General: Supple without adenopathy. Thyroid : Thyroid  size normal.  No goiter or nodules appreciated. No thyroid  bruit.  Lungs: Clear with good BS bilat with no rales, rhonchi, or wheezes  Heart: Auscultation: RRR.  Abdomen: Normoactive bowel sounds, soft, nontender, without masses or organomegaly  palpable  Extremities:  BL LE: Trace  pretibial edema   Mental Status: Judgment, insight: Intact Orientation: Oriented to time, place, and person Mood and affect: No depression, anxiety, or agitation     DATA REVIEWED:  Latest Reference Range & Units 05/12/22 09:13  TSH 0.35 - 5.50 uIU/mL 0.97  T4,Free(Direct) 0.60 - 1.60 ng/dL 5.36    Latest Reference Range & Units 04/03/23 09:30  COMPREHENSIVE METABOLIC PANEL WITH GFR  Rpt !  Sodium 135 - 145 mEq/L 140  Potassium 3.5 - 5.1 mEq/L 4.5  Chloride 96 - 112 mEq/L 103  CO2 19 - 32 mEq/L 30  Glucose 70 - 99 mg/dL 644 (H)  BUN 6 - 23 mg/dL 11  Creatinine 0.34 - 7.42 mg/dL 5.95  Calcium 8.4 - 63.8 mg/dL 9.4  Alkaline Phosphatase 39 - 117 U/L 65  Albumin 3.5 - 5.2 g/dL 4.3  AST 0 - 37 U/L 26  ALT 0 - 35 U/L 48 (H)   Total Protein 6.0 - 8.3 g/dL 6.7  Total Bilirubin 0.2 - 1.2 mg/dL 0.3  GFR >75.64 mL/min 80.40  !: Data is abnormal (H): Data is abnormally high Rpt: View report in Results Review for more information   ASSESSMENT / PLAN / RECOMMENDATIONS:   Hypothyroidism:   -Patient is clinically euthyroid -No local neck symptoms - Pt educated extensively on the correct way to take levothyroxine  (first thing in the morning with water, 30 minutes before eating or taking other medications). - Pt encouraged to double dose the following day if she were to miss a dose given long half-life of levothyroxine . -TSH remains within normal range   Medications   Continue levothyroxine  150 mcg daily     Follow-up in 6 months  Signed electronically by: Natale Bail, MD  [facility]  [facility] [address] [place] [place], [place] [address] Phone: [phone] FAX: [phone]      CC: Tonna Frederic, MD [address] [place] [address] Phone: [phone]  Fax: [phone]   Return to Endocrinology clinic as below: Future Appointments  Date Time Provider Department Center  05/14/2023  8:30 AM Luana Tatro, Julian Obey, MD [id number] None  10/11/2023  8:40 AM Tonna Frederic, MD [facility]

## 2023-05-17 ENCOUNTER — Encounter: Payer: Self-pay | Admitting: Internal Medicine

## 2023-05-17 ENCOUNTER — Ambulatory Visit (INDEPENDENT_AMBULATORY_CARE_PROVIDER_SITE_OTHER): Payer: PRIVATE HEALTH INSURANCE | Admitting: Internal Medicine

## 2023-05-17 VITALS — BP 114/72 | HR 72 | Ht 65.0 in | Wt 231.0 lb

## 2023-05-17 DIAGNOSIS — E010 Iodine-deficiency related diffuse (endemic) goiter: Secondary | ICD-10-CM

## 2023-05-17 DIAGNOSIS — R600 Localized edema: Secondary | ICD-10-CM

## 2023-05-17 DIAGNOSIS — E039 Hypothyroidism, unspecified: Secondary | ICD-10-CM

## 2023-05-17 NOTE — Progress Notes (Signed)
 Name: Alexis Alvarado  MRN/ DOB: [id number], [birth year]    Age/ Sex: 48 y.o., female     PCP: Tonna Frederic, MD   Reason for Endocrinology Evaluation: Hypothyroidism     Initial Endocrinology Clinic Visit: 10/22/2020    PATIENT IDENTIFIER: Alexis Alvarado is a 48 y.o., female with a past medical history of hypothyroidism. She has followed with [facility] since 10/22/2020  for consultative assistance with management of her hypothyroidism.   HISTORICAL SUMMARY: The patient was first diagnosed with hypothyroidism in 2011 , she has been on LT-4 replacement therapy since her diagnosis.   No XRT   Thyroid  ultrasound in 2022 revealed no nodules   Maternal GM with thyroid  disease   SUBJECTIVE:    Today (05/17/2023):  Alexis Alvarado is here for a follow up on hypothyroidism.   Weight has been trending up Denies local neck swelling  No constipation anymore, attributes to taking MVI  Denies  palpitations  Denies tremors  She has Mirena  No Biotin   Levothyroxine  150 mcg daily    HISTORY:  Past Medical History:  Past Medical History:  Diagnosis Date   Anxiety    Chronic back pain    Hypothyroidism    Thyroid  disease    hypothyroid   Past Surgical History:  Past Surgical History:  Procedure Laterality Date   LEG SURGERY     TUBAL LIGATION     tubual reversal     Social History:  reports that she quit smoking about 22 years ago. Her smoking use included cigarettes. She has never used smokeless tobacco. She reports that she does not drink alcohol and does not use drugs. Family History:  Family History  Problem Relation Age of Onset   COPD Mother    Colon polyps Father    Hypertension Father    Thyroid  disease Maternal Grandmother    Colon cancer Neg Hx    Esophageal cancer Neg Hx    Rectal cancer Neg Hx    Stomach cancer Neg Hx      HOME MEDICATIONS: Allergies as of 05/17/2023       Reactions   Dexamethasone Rash   Patient states she  is not allergic.         Medication List        Accurate as of May 17, 2023 10:46 AM. If you have any questions, ask your nurse or doctor.          diazepam  5 MG tablet Commonly known as: VALIUM  SMARTSIG:1 Tablet(s) By Mouth   escitalopram  10 MG tablet Commonly known as: LEXAPRO  Take 3 tablets (30 mg total) by mouth daily.   eszopiclone  2 MG Tabs tablet Commonly known as: Lunesta  Take 1 tablet (2 mg total) by mouth at bedtime as needed for sleep. Take immediately before bedtime   levothyroxine  150 MCG tablet Commonly known as: SYNTHROID  Take 1 tablet (150 mcg total) by mouth daily.   Mirena (52 MG) 20 MCG/24HR Iud Generic drug: levonorgestrel Mirena 20 mcg/24 hours (6 yrs) 52 mg intrauterine device  Take 1 device by intrauterine route.          OBJECTIVE:   PHYSICAL EXAM: VS: BP 114/72 (BP Location: Left Arm, Patient Position: Sitting, Cuff Size: Normal)   Pulse 72   Ht 5\' 5"  (1.651 m)   Wt 231 lb (104.8 kg)   SpO2 98%   BMI 38.44 kg/m    EXAM: General: Pt appears well and is in NAD  Neck: General:  Supple without adenopathy. Thyroid : Thyroid  size normal.  Right asymmetry noted   Lungs: Clear with good BS bilat   Heart: Auscultation: RRR.  Abdomen: soft, nontender  Extremities:  BL LE: Trace  pretibial edema   Mental Status: Judgment, insight: Intact Orientation: Oriented to time, place, and person Mood and affect: No depression, anxiety, or agitation     DATA REVIEWED:   Latest Reference Range & Units 05/17/23 11:03  TSH mIU/L 0.45  T4,Free(Direct) 0.8 - 1.8 ng/dL 1.8      Latest Reference Range & Units 04/03/23 09:30  COMPREHENSIVE METABOLIC PANEL WITH GFR  Rpt !  Sodium 135 - 145 mEq/L 140  Potassium 3.5 - 5.1 mEq/L 4.5  Chloride 96 - 112 mEq/L 103  CO2 19 - 32 mEq/L 30  Glucose 70 - 99 mg/dL 161 (H)  BUN 6 - 23 mg/dL 11  Creatinine 0.96 - 0.45 mg/dL 4.09  Calcium 8.4 - 81.1 mg/dL 9.4  Alkaline Phosphatase 39 - 117 U/L 65   Albumin 3.5 - 5.2 g/dL 4.3  AST 0 - 37 U/L 26  ALT 0 - 35 U/L 48 (H)  Total Protein 6.0 - 8.3 g/dL 6.7  Total Bilirubin 0.2 - 1.2 mg/dL 0.3  GFR >91.47 mL/min 80.40     Thyroid  Ultrasound 05/19/2023  Estimated total number of nodules >/= 1 cm: 0   Number of spongiform nodules >/=  2 cm not described below (TR1): 0   Number of mixed cystic and solid nodules >/= 1.5 cm not described below (TR2): 0   _________________________________________________________   Markedly heterogeneous hypoechoic thyroid  gland with a left lower pole dystrophic calcification noted. Appearance compatible with chronic medical thyroid  disease. No focal abnormality, nodule or mass. No regional adenopathy. No hypervascularity.   IMPRESSION: 1. Markedly heterogeneous hypoechoic thyroid  gland compatible with chronic medical thyroid  disease. 2. No other significant finding by ultrasound.   ASSESSMENT / PLAN / RECOMMENDATIONS:   Hypothyroidism:   -Patient is clinically euthyroid -No local neck symptoms - Pt educated extensively on the correct way to take levothyroxine  (first thing in the morning with water, 30 minutes before eating or taking other medications). - Pt encouraged to double dose the following day if she were to miss a dose given long half-life of levothyroxine . -TSH remains within normal range   Medications   Continue levothyroxine  150 mcg daily   2.  Thyromegaly   - Patient noted with right thyroid  asymmetry, thyroid  ultrasound 2022 did not reveal any nodules, I would like to proceed with a repeat this year - Repeat ultrasound continues to show no evidence of nodules  3. LE edema   - We discussed importance of avoiding salt    Follow-up in 1 year  Signed electronically by: Natale Bail, MD  [facility]  [facility] [address] [place] [place], [place] [address] Phone: [phone] FAX: [phone]      CC: Tonna Frederic, MD [address] [place] [address] Phone: [phone]  Fax: [phone]   Return to Endocrinology clinic as below: Future Appointments  Date Time Provider Department Center  05/17/2023 10:50 AM Imane Burrough, Julian Obey, MD [id number] None  10/11/2023  8:40 AM Tonna Frederic, MD [id number]

## 2023-05-17 NOTE — Patient Instructions (Signed)

## 2023-05-18 ENCOUNTER — Ambulatory Visit
Admission: RE | Admit: 2023-05-18 | Discharge: 2023-05-18 | Disposition: A | Discharge status: Home / Self Care | Payer: PRIVATE HEALTH INSURANCE | Source: Ambulatory Visit | Attending: Internal Medicine | Admitting: Internal Medicine

## 2023-05-18 DIAGNOSIS — E039 Hypothyroidism, unspecified: Secondary | ICD-10-CM

## 2023-05-18 LAB — T4, FREE: Free T4: 1.8 ng/dL (ref 0.8–1.8)

## 2023-05-18 LAB — TSH: TSH: 0.45 m[IU]/L

## 2023-05-21 ENCOUNTER — Encounter: Payer: Self-pay | Admitting: Internal Medicine

## 2023-05-21 MED ORDER — LEVOTHYROXINE SODIUM 150 MCG PO TABS: 150.0000 ug | ORAL_TABLET | Freq: Every day | ORAL | 3 refills | Status: AC

## 2023-10-02 IMAGING — US US THYROID
1 series · 14 of 25 positions shown · non-contrast
Comparison: None.

CLINICAL DATA: Goiter palpated on physical examination

Hyperthyroidism
EXAM:
THYROID ULTRASOUND
TECHNIQUE: Ultrasound examination of the thyroid gland and adjacent soft
tissues was performed.

[Series 1: us thyroid · 0.06mm/px · 14 of 49 slices shown]
[im 1/49]
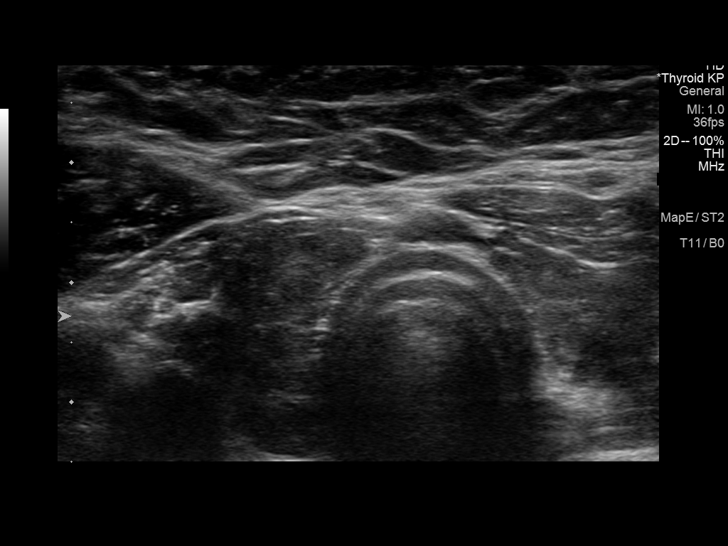
[im 5/49]
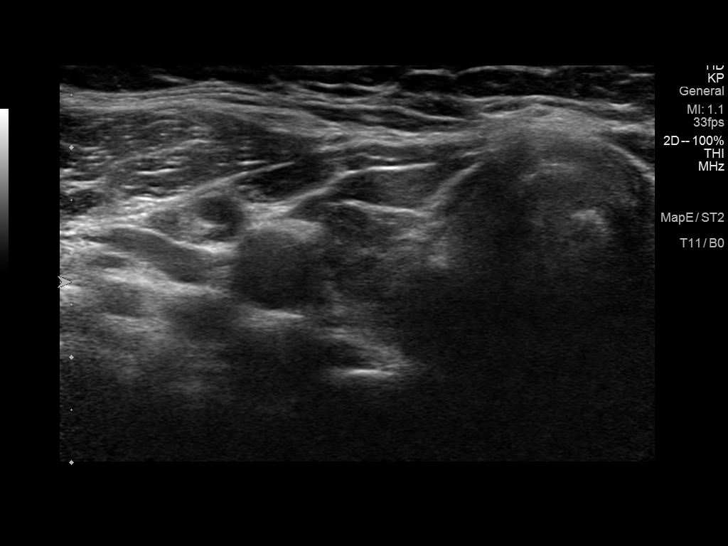
[im 9/49]
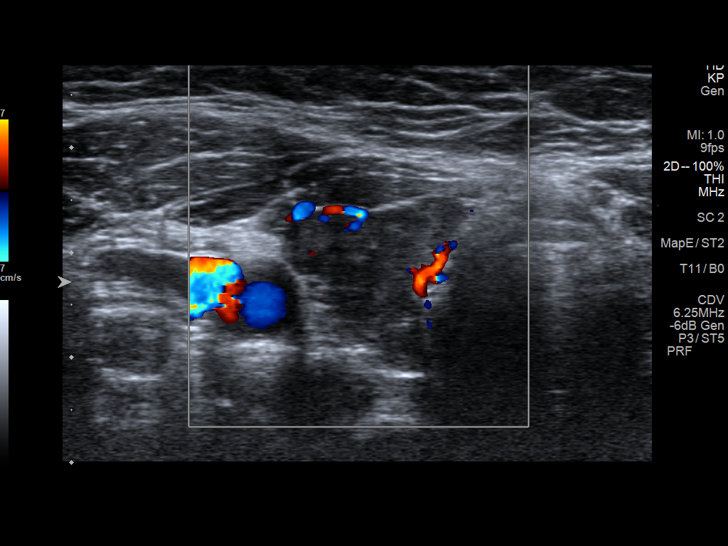
[im 13/49]
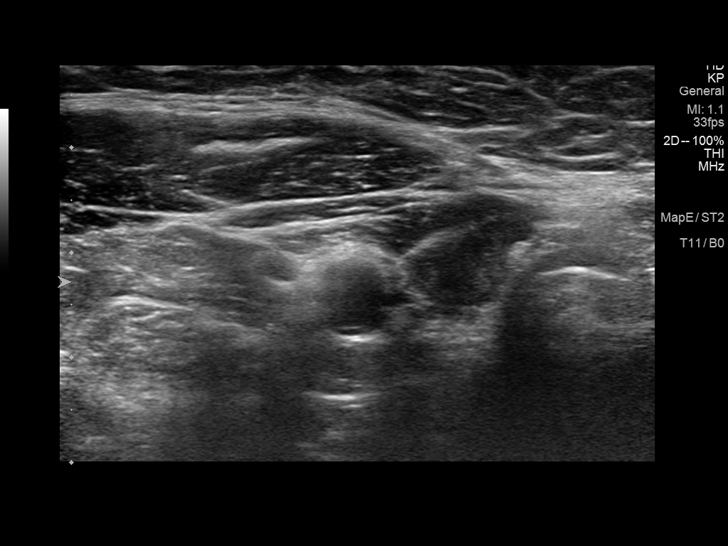
[im 17/49]
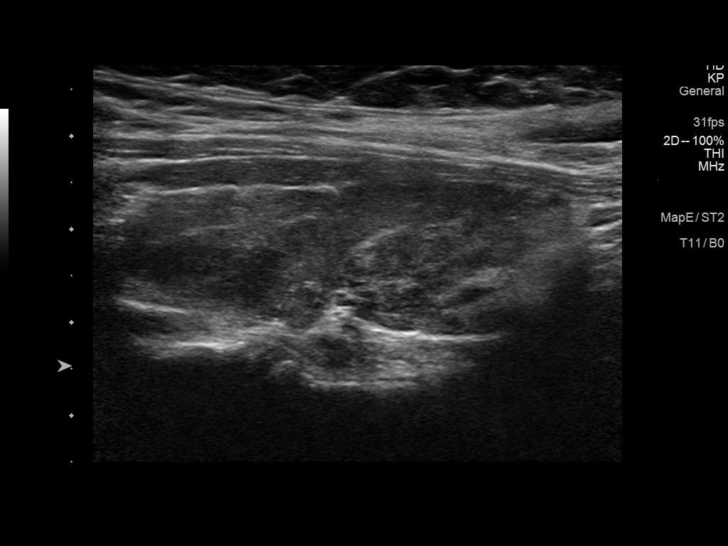
[im 19/49]
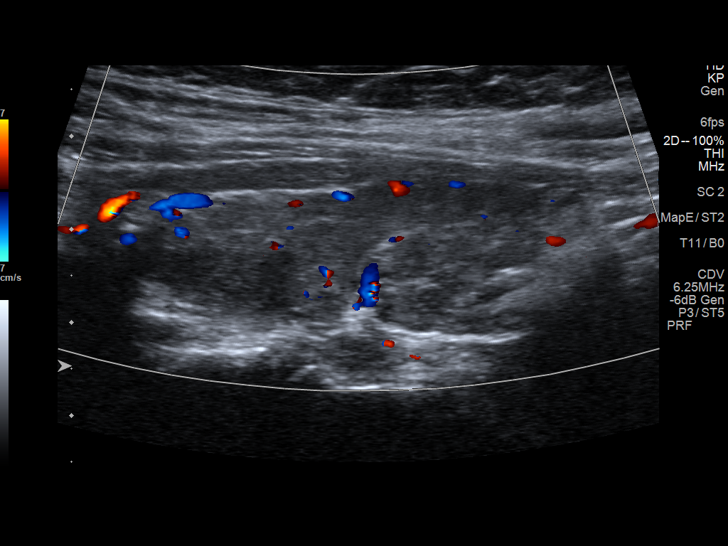
[im 23/49]
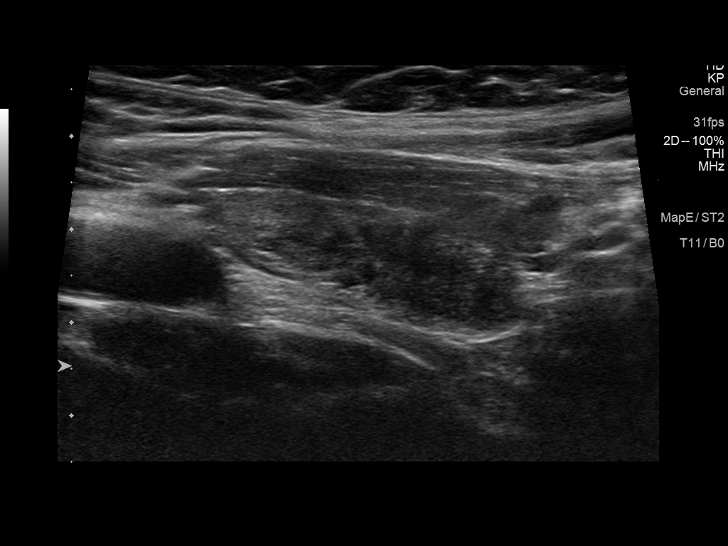
[im 27/49]
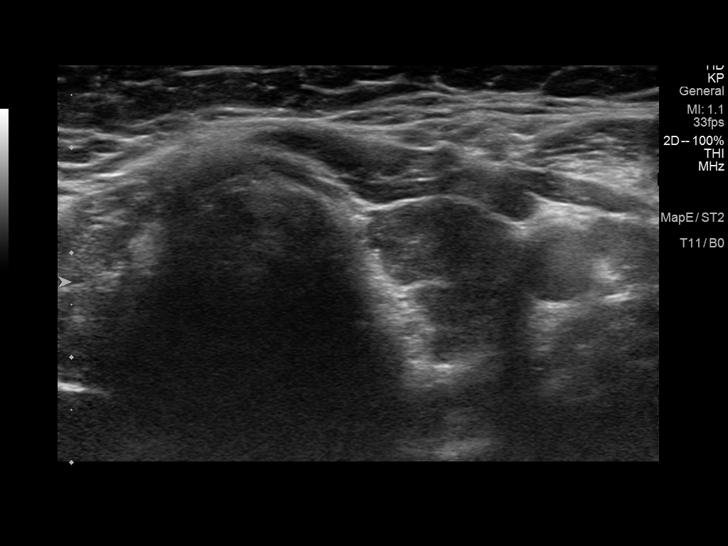
[im 31/49]
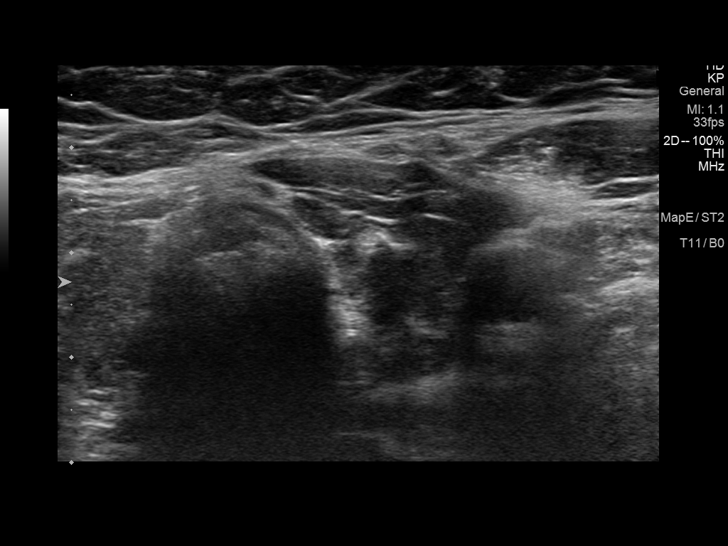
[im 33/49]
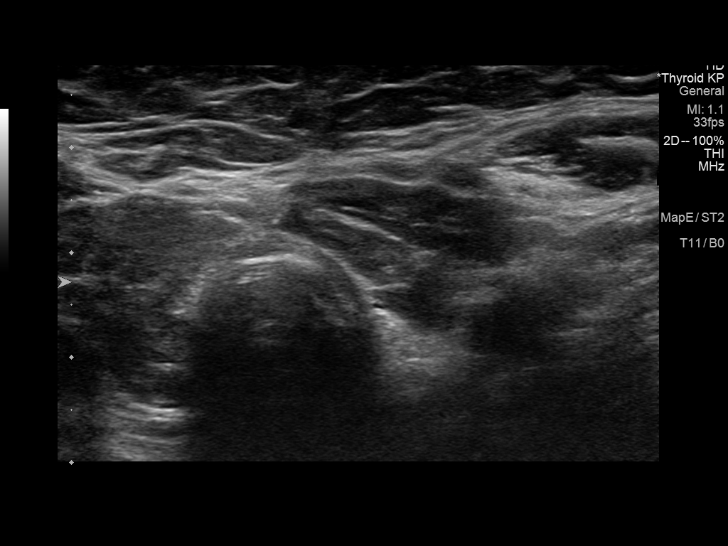
[im 37/49]
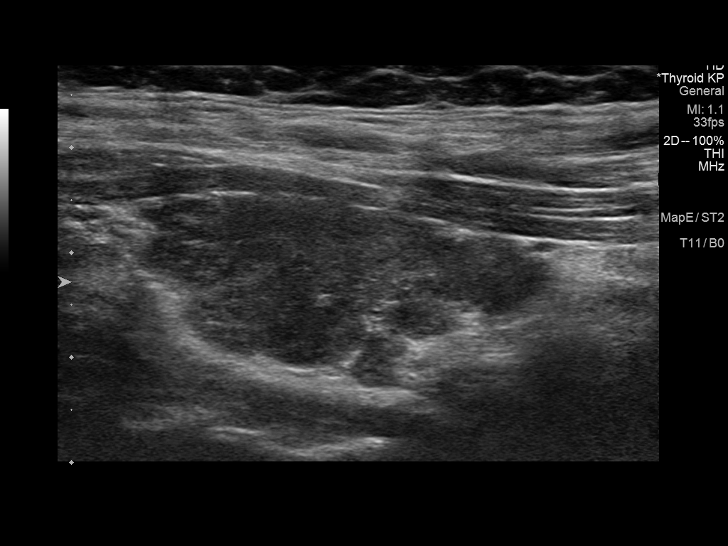
[im 41/49]
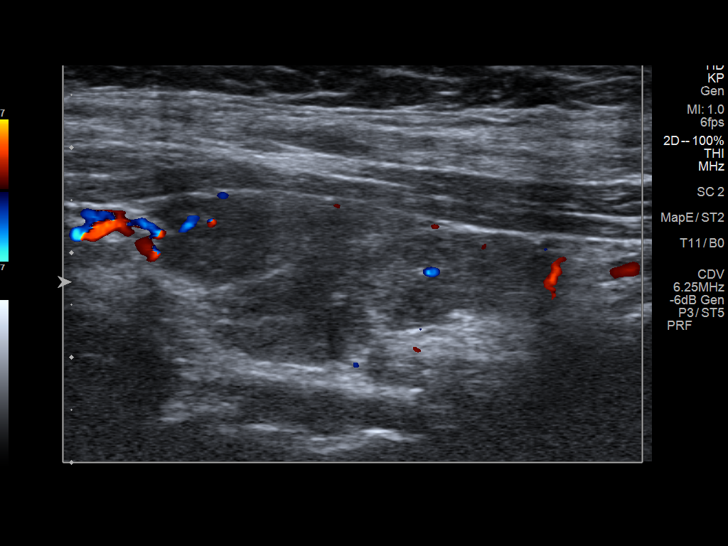
[im 45/49]
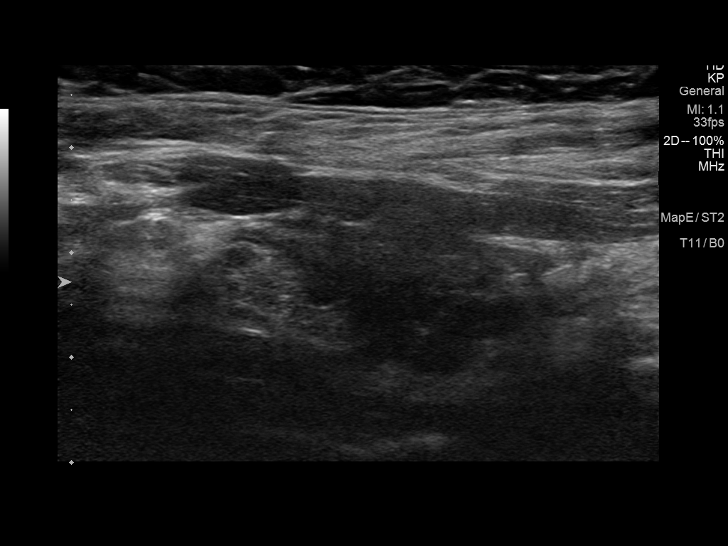
[im 49/49]
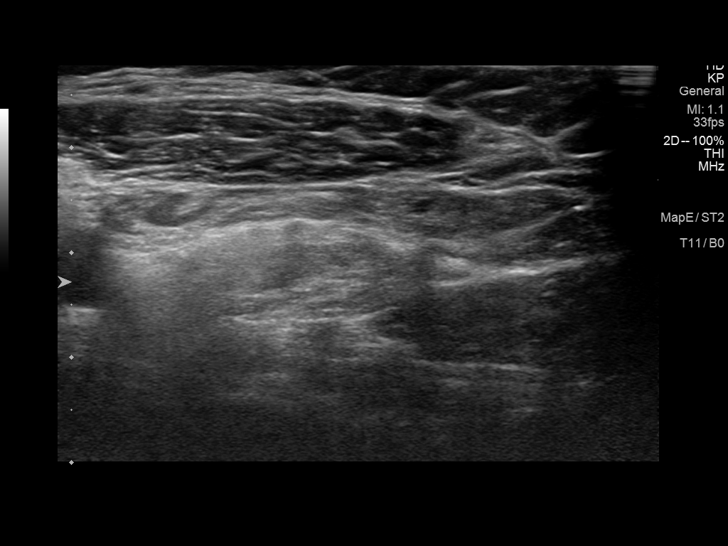

[14 of 25 positions shown; findings below may reference images not displayed]

FINDINGS: Parenchymal Echotexture: Moderately heterogeneous

Isthmus: 0.3 cm

Right lobe: 5.0 x 1.7 x 1.5 cm

Left lobe: 4.1 x 1.6 x 1.5 cm

_________________________________________________________

Estimated total number of nodules >/= 1 cm: 0

Number of spongiform nodules >/=  2 cm not described below (TR1): 0

Number of mixed cystic and solid nodules >/= 1.5 cm not described
below (TR2): 0

_________________________________________________________

No discrete nodules are seen within the thyroid gland.
IMPRESSION: Moderate heterogeneity of the thyroid parenchyma without discrete
nodule.

The above is in keeping with the ACR TI-RADS recommendations - [HOSPITAL] 5279;[DATE].

## 2023-10-11 ENCOUNTER — Ambulatory Visit: Payer: PRIVATE HEALTH INSURANCE | Admitting: Family Medicine

## 2023-11-07 LAB — HM MAMMOGRAPHY: HM Mammogram: NORMAL (ref 0–4)

## 2023-11-22 ENCOUNTER — Encounter: Payer: Self-pay | Admitting: Family Medicine

## 2023-11-22 ENCOUNTER — Ambulatory Visit (INDEPENDENT_AMBULATORY_CARE_PROVIDER_SITE_OTHER): Payer: PRIVATE HEALTH INSURANCE | Admitting: Family Medicine

## 2023-11-22 VITALS — BP 134/86 | HR 62 | Temp 98.1°F | Ht 65.0 in | Wt 230.0 lb

## 2023-11-22 DIAGNOSIS — R221 Localized swelling, mass and lump, neck: Secondary | ICD-10-CM

## 2023-11-22 DIAGNOSIS — F418 Other specified anxiety disorders: Secondary | ICD-10-CM

## 2023-11-22 DIAGNOSIS — Z6838 Body mass index (BMI) 38.0-38.9, adult: Secondary | ICD-10-CM | POA: Diagnosis not present

## 2023-11-22 DIAGNOSIS — R030 Elevated blood-pressure reading, without diagnosis of hypertension: Secondary | ICD-10-CM

## 2023-11-22 DIAGNOSIS — D1722 Benign lipomatous neoplasm of skin and subcutaneous tissue of left arm: Secondary | ICD-10-CM

## 2023-11-22 DIAGNOSIS — G4700 Insomnia, unspecified: Secondary | ICD-10-CM

## 2023-11-22 LAB — CBC WITH DIFFERENTIAL/PLATELET
Basophils Absolute: 0 K/uL (ref 0.0–0.1)
Basophils Relative: 0.4 % (ref 0.0–3.0)
Eosinophils Absolute: 0.1 K/uL (ref 0.0–0.7)
Eosinophils Relative: 2 % (ref 0.0–5.0)
HCT: 42 % (ref 36.0–46.0)
Hemoglobin: 14.4 g/dL (ref 12.0–15.0)
Lymphocytes Relative: 37.5 % (ref 12.0–46.0)
Lymphs Abs: 2.2 K/uL (ref 0.7–4.0)
MCHC: 34.3 g/dL (ref 30.0–36.0)
MCV: 83.5 fl (ref 78.0–100.0)
Monocytes Absolute: 0.4 K/uL (ref 0.1–1.0)
Monocytes Relative: 6.1 % (ref 3.0–12.0)
Neutro Abs: 3.2 K/uL (ref 1.4–7.7)
Neutrophils Relative %: 54 % (ref 43.0–77.0)
Platelets: 200 K/uL (ref 150.0–400.0)
RBC: 5.03 Mil/uL (ref 3.87–5.11)
RDW: 13.9 % (ref 11.5–15.5)
WBC: 5.9 K/uL (ref 4.0–10.5)

## 2023-11-22 MED ORDER — ESCITALOPRAM OXALATE 10 MG PO TABS: 30.0000 mg | ORAL_TABLET | Freq: Every day | ORAL | 2 refills | Status: AC

## 2023-11-22 MED ORDER — ESZOPICLONE 2 MG PO TABS
2.0000 mg | ORAL_TABLET | Freq: Every evening | ORAL | 2 refills | Status: AC | PRN
Start: 2023-11-22 — End: ?

## 2023-11-22 NOTE — Progress Notes (Signed)
 Established Patient Office Visit   Subjective:  Patient ID: Alexis Alvarado, female    DOB: Jun 30, 1975  Age: 48 y.o. MRN: [id number]  Chief Complaint  Patient presents with   6 month follow up    Pt is here today to be seen for her 6 month follow up. Pt mentions that has noticed a bump under the right side of her jaw with some tenderness. This has been present since July. Fasting.    HPI Encounter Diagnoses  Name Primary?   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  Escitalopram  and as needed Lunesta  remain helpful.  She is remaining active with some walking.  Has difficulty losing weight.  Concerned about a possible mass in her right jawline.  Concerned about a mass in her left upper arm.   Review of Systems  Constitutional: Negative.   HENT: Negative.    Eyes:  Negative for blurred vision, discharge and redness.  Respiratory: Negative.    Cardiovascular: Negative.   Gastrointestinal:  Negative for abdominal pain.  Genitourinary: Negative.   Musculoskeletal: Negative.  Negative for myalgias.  Skin:  Negative for rash.  Neurological:  Negative for tingling, loss of consciousness and weakness.  Endo/Heme/Allergies:  Negative for polydipsia.     Current Outpatient Medications:    levonorgestrel (MIRENA, 52 MG,) 20 MCG/24HR IUD, Mirena 20 mcg/24 hours (6 yrs) 52 mg intrauterine device  Take 1 device by intrauterine route., Disp: , Rfl:    levothyroxine  (SYNTHROID ) 150 MCG tablet, Take 1 tablet (150 mcg total) by mouth daily., Disp: 90 tablet, Rfl: 3   escitalopram  (LEXAPRO ) 10 MG tablet, Take 3 tablets (30 mg total) by mouth daily., Disp: 270 tablet, Rfl: 2   eszopiclone  (LUNESTA ) 2 MG TABS tablet, Take 1 tablet (2 mg total) by mouth at bedtime as needed for sleep. Take  immediately before bedtime, Disp: 30 tablet, Rfl: 2   Objective:     BP 134/86 (BP Location: Left Arm, Cuff Size: Normal)   Pulse 62   Temp 98.1 F (36.7 C) (Temporal)   Ht 5' 5 (1.651 m)   Wt 230 lb (104.3 kg)   SpO2 98%   BMI 38.27 kg/m  BP Readings from Last 3 Encounters:  11/22/23 134/86  05/17/23 114/72  04/03/23 128/88   Wt Readings from Last 3 Encounters:  11/22/23 230 lb (104.3 kg)  05/17/23 231 lb (104.8 kg)  04/03/23 229 lb 12.8 oz (104.2 kg)      Physical Exam Constitutional:      General: She is not in acute distress.    Appearance: Normal appearance. She is not ill-appearing, toxic-appearing or diaphoretic.  HENT:     Head: Normocephalic and atraumatic.     Right Ear: Tympanic membrane, ear canal and external ear normal.     Left Ear: Tympanic membrane, ear canal and external ear normal.     Mouth/Throat:     Mouth: Mucous membranes are moist.     Pharynx: Oropharynx is clear. No oropharyngeal exudate or posterior oropharyngeal erythema.  Eyes:     General: No scleral icterus.       Right eye: No discharge.        Left eye: No discharge.     Extraocular Movements: Extraocular movements  intact.     Conjunctiva/sclera: Conjunctivae normal.     Pupils: Pupils are equal, round, and reactive to light.  Neck:     Trachea: Trachea and phonation normal.  Cardiovascular:     Rate and Rhythm: Normal rate and regular rhythm.  Pulmonary:     Effort: Pulmonary effort is normal. No respiratory distress.     Breath sounds: Normal breath sounds. No wheezing or rales.  Musculoskeletal:       Arms:     Cervical back: Normal range of motion. No rigidity or tenderness.  Lymphadenopathy:     Cervical: No cervical adenopathy.     Right cervical: No superficial or deep cervical adenopathy.    Left cervical: No superficial or deep cervical adenopathy.  Skin:    General: Skin is warm and dry.  Neurological:     Mental Status: She is alert and oriented to person,  place, and time.  Psychiatric:        Mood and Affect: Mood normal.        Behavior: Behavior normal.      No results found for any visits on 11/22/23.    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  Dispense: 270 tablet; Refill: 2  Elevated BP without diagnosis of hypertension  Insomnia, unspecified type -     Eszopiclone ; Take 1 tablet (2 mg total) by mouth at bedtime as needed for sleep. Take immediately before bedtime  Dispense: 30 tablet; Refill: 2    Return in about 6 months (around 05/22/2024), or if symptoms worsen or fail to improve.  BP mildly elevated.  Information was given on preventing hypertension.  Information given on exercising to help with weight loss.  Referral to weight loss management.  Information given on lipomas.  Will let me know if the lipoma diagnosed today changes.  Soft tissue ultrasound of neck for mass of concern in the subright mandibular area.  Elsie Sim Lent, MD

## 2023-11-29 ENCOUNTER — Encounter (INDEPENDENT_AMBULATORY_CARE_PROVIDER_SITE_OTHER): Payer: Self-pay

## 2023-11-29 ENCOUNTER — Ambulatory Visit (INDEPENDENT_AMBULATORY_CARE_PROVIDER_SITE_OTHER): Payer: PRIVATE HEALTH INSURANCE | Admitting: Family Medicine

## 2023-11-29 ENCOUNTER — Encounter (INDEPENDENT_AMBULATORY_CARE_PROVIDER_SITE_OTHER): Payer: Self-pay | Admitting: Family Medicine

## 2023-11-29 VITALS — BP 128/83 | HR 88 | Temp 98.1°F | Ht 64.5 in | Wt 231.0 lb

## 2023-11-29 DIAGNOSIS — E669 Obesity, unspecified: Secondary | ICD-10-CM | POA: Diagnosis not present

## 2023-11-29 DIAGNOSIS — Z0289 Encounter for other administrative examinations: Secondary | ICD-10-CM

## 2023-11-29 DIAGNOSIS — E039 Hypothyroidism, unspecified: Secondary | ICD-10-CM

## 2023-11-29 DIAGNOSIS — Z6839 Body mass index (BMI) 39.0-39.9, adult: Secondary | ICD-10-CM

## 2023-11-29 DIAGNOSIS — Z8632 Personal history of gestational diabetes: Secondary | ICD-10-CM

## 2023-11-29 DIAGNOSIS — F418 Other specified anxiety disorders: Secondary | ICD-10-CM | POA: Diagnosis not present

## 2023-11-29 NOTE — Progress Notes (Signed)
 Alexis DOROTHA Jenkins, DO, ABFM, ABOM Bariatric physician [address] [place], [place], [place] [address] Office: [phone]  /  Fax: [phone]     Initial Evaluation:  Alexis Alvarado was seen in clinic today to evaluate for obesity. She is interested in losing weight to improve overall health and reduce the risk of weight related complications. She presents today to review program treatment options, initial physical assessment, and evaluation.      She was referred by: PCP  When asked what they hope to accomplish? She states: adopt healthier eating patterns and improve quality of life.   When asked how has your weight affected you? She states: Contributed to medical problems, Contributed to orthopedic problems or mobility issues, Problems with eating patterns, and Has affected mood   Contributing factors to her weight change: family history of obesity, consumption of processed foods, moderate to high levels of stress, reduced physical activity, and problems with eating patterns have no time for anything  Some associated conditions: Arthritis:back and hip, Hyperlipidemia, and Other: h/o gestational diabetes  Current nutrition plan: None  Current level of physical activity: None  Current or previous pharmacotherapy: Phentermine - previously a long time ago for about 6 months   Response to medication: Was cost prohibitive or lost coverage for AOM   Barriers to weight loss that patient expresses a concern about today: lack of time for self-care, multiple competing priorities, and having difficulty with meal prep and planning.     Past Medical History:  Diagnosis Date   Anxiety    Chronic back pain    Hypothyroidism    Thyroid  disease    hypothyroid    Current Outpatient Medications  Medication Instructions   escitalopram  (LEXAPRO ) 30 mg, Oral, Daily   eszopiclone  (LUNESTA ) 2 mg, Oral, At bedtime PRN, Take immediately before bedtime   levonorgestrel (MIRENA, 52 MG,) 20  MCG/24HR IUD Mirena 20 mcg/24 hours (6 yrs) 52 mg intrauterine device  Take 1 device by intrauterine route.   levothyroxine  (SYNTHROID ) 150 mcg, Oral, Daily     Allergies  Allergen Reactions   Dexamethasone Rash    Patient states she is not allergic.      Past Surgical History:  Procedure Laterality Date   LEG SURGERY     TUBAL LIGATION     tubal reversal in 2013   tubual reversal       Family History  Problem Relation Age of Onset   COPD Mother    Colon polyps Father    Hypertension Father    Thyroid  disease Maternal Grandmother    Colon cancer Neg Hx    Esophageal cancer Neg Hx    Rectal cancer Neg Hx    Stomach cancer Neg Hx      Objective:  BP 128/83   Pulse 88   Temp 98.1 F (36.7 C)   Ht 5' 4.5 (1.638 m)   Wt 231 lb (104.8 kg)   SpO2 97%   BMI 39.04 kg/m  She was weighed on the bioimpedance scale: Body mass index is 39.04 kg/m.  Visceral Fat rating : 14, Body Fat %: 48.3  Weight Lost Since Last Visit: 0  Weight Gained Since Last Visit: 0    Vitals Temp: 98.1 F (36.7 C) BP: 128/83 Pulse Rate: 88 SpO2: 97 %   Anthropometric Measurements Height: 5' 4.5 (1.638 m) Weight: 231 lb (104.8 kg) BMI (Calculated): 39.05 Weight at Last Visit: 0 Weight Lost Since Last Visit: 0 Weight Gained Since Last Visit: 0 Starting Weight:  0 Total Weight Loss (lbs): 0 lb (0 kg) Peak Weight: 230lb Waist Measurement : 0 inches   Body Composition  Body Fat %: 48.3 % Fat Mass (lbs): 111.6 lbs Muscle Mass (lbs): 113.6 lbs Total Body Water (lbs): 88.4 lbs Visceral Fat Rating : 14   Other Clinical Data Fasting: no Labs: no Today's Visit #: Consult Starting Date: -- (NA) Comments: Consult     General: Well Developed, well nourished, and in no acute distress.  HEENT: Normocephalic, atraumatic; EOMI, sclerae are anicteric. Skin: Warm and dry, good turgor Chest:  Normal excursion, shape, no gross ABN Respiratory: No conversational dyspnea; speaking in  full sentences NeuroM-Sk:  Normal gross ROM * 4 extremities  Psych: A and O *3, insight adequate, mood- full    Assessment and Plan:   FOR THE DISEASE OF OBESITY:  BMI 39.0-39.9,adult Assessment & Plan: We reviewed anthropometrics, biometrics, associated medical conditions and contributing factors with patient. [name] would benefit from a medically tailored reduced calorie nutrional plan based on their REE (resting energy expenditure), which will be determined by indirect calorimetry.  We will also assess for cardiometabolic risk and nutritional derangements via fasting labs at intake appointment.    Obesity Treatment / Action Plan:   she was weighed on the bioimpedance scale and results were discussed and documented in the synopsis.   Bina Veenstra will complete provided nutritional and psychosocial assessment questionnaire before the next appointment.  she will be scheduled for indirect calorimetry to determine resting energy expenditure in a fasting state.  This will allow us  to create a reduced calorie, high-protein meal plan to promote loss of fat mass while preserving muscle mass.  We will also assess for cardiometabolic risk and nutritional derangements via an ECG and fasting serologies at her next appointment.  she was encouraged to work on amassing support from family and friends to begin their weight loss journey.   Work on eliminating or reducing the presence of highly processed, poorly nutritious, calorie-dense foods in the home.   Obesity Education Performed Today:  Patient was counseled on nutritional approaches to weight loss and benefits of reducing processed foods and consuming plant-based foods and high quality protein as part of nutritional weight management program.   We discussed the importance of long term lifestyle changes which include nutrition, exercise and behavioral modifications as well as the importance of customizing this to her specific health and  social needs.   We discussed the benefits of reaching a healthier weight to alleviate the symptoms of existing conditions and reduce the risks of the biomechanical, metabolic and psychological effects of obesity.  Was counseled on the health benefits of losing 5%-10% of total body weight.  Was counseled on our cognitive behavorial therapy program, lead by our bariatric psychologist, who focuses on emotional eating and creating positive behavorial change.  Was counseled on bariatric pharmacotherapy and how this may be used as an adjunct in their weight management    Dorthie appears to be in the action stage of change and states they are ready to start intensive lifestyle modifications and behavioral modifications.  It was recommended that she follow up in the next 1-2 weeks to review the above steps, and to continue with treatment of their chronic disease state of obesity   FOR OTHER CONDITIONS RELATED TO THE DISEASE OF OBESITY:  Depression with anxiety Assessment & Plan On Lexapro  30 mg daily, and Lunesta  2 mg at bedtime to help her sleep. Patient denies any SI/HI. Patient states that she  feels that her mood is well stabilized. This program could benefit patient with helping with weight loss to help her feel better and begin exercise to help with mental well being.  Continue with medications.     Acquired hypothyroidism Assessment & Plan On Synthroid  150 mcg daily. Good compliance and tolerance. Patient states that her symptoms are well controlled. Patient states that was tested by her OB/GYN. Patient would benefit from a program like this and a set nutritional plan.     History of gestational diabetes Assessment & Plan Lab Results  Component Value Date   HGBA1C 5.9 04/03/2023   HGBA1C 5.7 05/18/2020   HGBA1C 5.1 05/28/2019   Patient states that she has a history of gestational diabetes. She states that she had this issue only with her oldest son. This was about 33 years ago.  Explained to patient how this predisposes her to prediabetes and diabetes. Patient would benefit from this program that focuses on nutritional meal plan with minimal amount of simple carbs and sugars.     Attestations:   LILLETTE Sonny Laroche, acting as a medical scribe for Alexis Jenkins, DO., have compiled all relevant documentation for today's office visit on behalf of Alexis Jenkins, DO, while in the presence of [employer], DO.  I have spent 49 minutes in the care of the patient today.  39 minutes was spent in face to face counseling of the patient on the disease of obesity and what our program can do for their medical conditions as well as in preventing future diseases. I discussed the importance of comprehensive care in the treatment of obesity including mental well being and physical activity. 10 minutes was spent on pre-chart review and additional post visit documentation.   I have reviewed the above documentation for accuracy and completeness, and I agree with the above. Alexis JINNY Alvarado, D.O.  The 21st Century Cures Act was signed into law in 2016 which includes the topic of electronic health records.  This provides immediate access to information in MyChart.  This includes consultation notes, operative notes, office notes, lab results and pathology reports.  If you have any questions about what you read please let us  know at your next visit so we can discuss your concerns and take corrective action if need be.  We are right here with you!

## 2024-05-16 ENCOUNTER — Ambulatory Visit: Payer: PRIVATE HEALTH INSURANCE | Admitting: Internal Medicine

## 2024-05-22 ENCOUNTER — Ambulatory Visit: Payer: PRIVATE HEALTH INSURANCE | Admitting: Family Medicine
